# Patient Record
Sex: Female | Born: 1960
Health system: Southern US, Community
[De-identification: ages and names within clinical notes are randomized; demographics above are authoritative.]

## PROBLEM LIST (undated history)

## (undated) DIAGNOSIS — J189 Pneumonia, unspecified organism: Secondary | ICD-10-CM

## (undated) DIAGNOSIS — I10 Essential (primary) hypertension: Secondary | ICD-10-CM

## (undated) DIAGNOSIS — E7800 Pure hypercholesterolemia, unspecified: Secondary | ICD-10-CM

## (undated) DIAGNOSIS — T8859XA Other complications of anesthesia, initial encounter: Secondary | ICD-10-CM

## (undated) DIAGNOSIS — R51 Headache: Secondary | ICD-10-CM

## (undated) DIAGNOSIS — T4145XA Adverse effect of unspecified anesthetic, initial encounter: Secondary | ICD-10-CM

## (undated) DIAGNOSIS — E039 Hypothyroidism, unspecified: Secondary | ICD-10-CM

## (undated) DIAGNOSIS — K259 Gastric ulcer, unspecified as acute or chronic, without hemorrhage or perforation: Secondary | ICD-10-CM

## (undated) HISTORY — PX: OTHER SURGICAL HISTORY: SHX169

## (undated) HISTORY — PX: BREAST BIOPSY: SHX20

## (undated) HISTORY — PX: TONSILLECTOMY: SUR1361

## (undated) HISTORY — PX: COLONOSCOPY: SHX174

## (undated) HISTORY — PX: KNEE ARTHROSCOPY W/ ORIF: SUR98

---

## 1987-01-03 HISTORY — PX: DILATION AND CURETTAGE OF UTERUS: SHX78

## 2000-01-03 HISTORY — PX: TUBAL LIGATION: SHX77

## 2000-04-30 ENCOUNTER — Other Ambulatory Visit: Admission: RE | Admit: 2000-04-30 | Discharge: 2000-04-30 | Payer: Self-pay | Admitting: *Deleted

## 2000-08-02 ENCOUNTER — Ambulatory Visit (HOSPITAL_COMMUNITY): Admission: RE | Admit: 2000-08-02 | Discharge: 2000-08-02 | Payer: Self-pay | Admitting: Family Medicine

## 2000-08-02 ENCOUNTER — Encounter: Payer: Self-pay | Admitting: Family Medicine

## 2001-05-16 ENCOUNTER — Other Ambulatory Visit: Admission: RE | Admit: 2001-05-16 | Discharge: 2001-05-16 | Payer: Self-pay | Admitting: Dermatology

## 2001-08-20 ENCOUNTER — Ambulatory Visit (HOSPITAL_COMMUNITY): Admission: RE | Admit: 2001-08-20 | Discharge: 2001-08-20 | Payer: Self-pay | Admitting: *Deleted

## 2001-08-20 ENCOUNTER — Encounter: Payer: Self-pay | Admitting: *Deleted

## 2003-02-11 ENCOUNTER — Emergency Department (HOSPITAL_COMMUNITY): Admission: EM | Admit: 2003-02-11 | Discharge: 2003-02-11 | Payer: Self-pay | Admitting: Emergency Medicine

## 2003-02-24 ENCOUNTER — Ambulatory Visit (HOSPITAL_COMMUNITY): Admission: RE | Admit: 2003-02-24 | Discharge: 2003-02-24 | Payer: Self-pay | Admitting: Family Medicine

## 2003-04-21 ENCOUNTER — Ambulatory Visit (HOSPITAL_COMMUNITY): Admission: RE | Admit: 2003-04-21 | Discharge: 2003-04-21 | Payer: Self-pay | Admitting: *Deleted

## 2004-01-03 HISTORY — PX: OTHER SURGICAL HISTORY: SHX169

## 2005-05-02 ENCOUNTER — Ambulatory Visit (HOSPITAL_COMMUNITY): Admission: RE | Admit: 2005-05-02 | Discharge: 2005-05-02 | Payer: Self-pay | Admitting: Obstetrics and Gynecology

## 2005-05-11 ENCOUNTER — Ambulatory Visit (HOSPITAL_COMMUNITY): Admission: RE | Admit: 2005-05-11 | Discharge: 2005-05-11 | Payer: Self-pay | Admitting: Obstetrics and Gynecology

## 2005-06-26 ENCOUNTER — Ambulatory Visit (HOSPITAL_COMMUNITY): Admission: RE | Admit: 2005-06-26 | Discharge: 2005-06-26 | Payer: Self-pay | Admitting: Obstetrics and Gynecology

## 2005-06-26 ENCOUNTER — Encounter (INDEPENDENT_AMBULATORY_CARE_PROVIDER_SITE_OTHER): Payer: Self-pay | Admitting: Specialist

## 2006-05-24 ENCOUNTER — Emergency Department (HOSPITAL_COMMUNITY): Admission: EM | Admit: 2006-05-24 | Discharge: 2006-05-25 | Payer: Self-pay | Admitting: Emergency Medicine

## 2006-05-24 ENCOUNTER — Ambulatory Visit (HOSPITAL_COMMUNITY): Admission: RE | Admit: 2006-05-24 | Discharge: 2006-05-24 | Payer: Self-pay | Admitting: Obstetrics and Gynecology

## 2007-03-19 ENCOUNTER — Other Ambulatory Visit: Admission: RE | Admit: 2007-03-19 | Discharge: 2007-03-19 | Payer: Self-pay | Admitting: Obstetrics and Gynecology

## 2007-04-25 ENCOUNTER — Ambulatory Visit (HOSPITAL_COMMUNITY): Admission: RE | Admit: 2007-04-25 | Discharge: 2007-04-25 | Payer: Self-pay | Admitting: Family Medicine

## 2007-05-18 ENCOUNTER — Emergency Department (HOSPITAL_COMMUNITY): Admission: EM | Admit: 2007-05-18 | Discharge: 2007-05-18 | Payer: Self-pay | Admitting: Emergency Medicine

## 2007-06-04 ENCOUNTER — Ambulatory Visit (HOSPITAL_COMMUNITY): Admission: RE | Admit: 2007-06-04 | Discharge: 2007-06-04 | Payer: Self-pay | Admitting: General Surgery

## 2007-07-04 ENCOUNTER — Ambulatory Visit (HOSPITAL_COMMUNITY): Admission: RE | Admit: 2007-07-04 | Discharge: 2007-07-04 | Payer: Self-pay | Admitting: Family Medicine

## 2007-07-17 ENCOUNTER — Ambulatory Visit (HOSPITAL_COMMUNITY): Admission: RE | Admit: 2007-07-17 | Discharge: 2007-07-17 | Payer: Self-pay | Admitting: Obstetrics and Gynecology

## 2007-12-18 ENCOUNTER — Ambulatory Visit (HOSPITAL_COMMUNITY): Admission: RE | Admit: 2007-12-18 | Discharge: 2007-12-18 | Payer: Self-pay | Admitting: Family Medicine

## 2009-04-01 IMAGING — CT CT ABDOMEN W/ CM
1 of 3 series · 14 of 32 positions shown, 19 images · IV contrast (Omnipaque 300)
Comparison: none

CT ABDOMEN AND PELVIS WITH CONTRAST
TECHNIQUE: Multidetector CT imaging of the abdomen and pelvis was
performed using the standard protocol following bolus
administration of intravenous contrast.

CT ABDOMEN

[Series 2: abd_pel 5.0 b40f · axial · 0.80mm/px · z∈[-508,-84]mm · 14 of 99 slices shown, 19 images]
[im 7/99  soft-tissue]
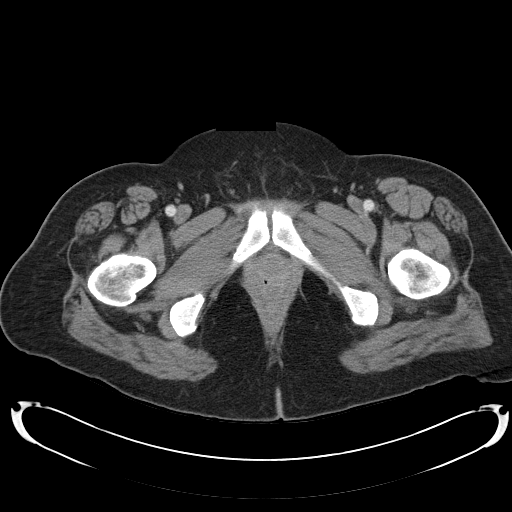
[im 7/99  bone]
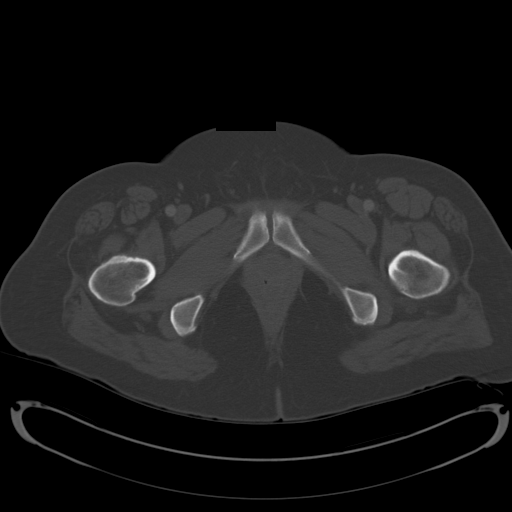
[im 13/99  soft-tissue]
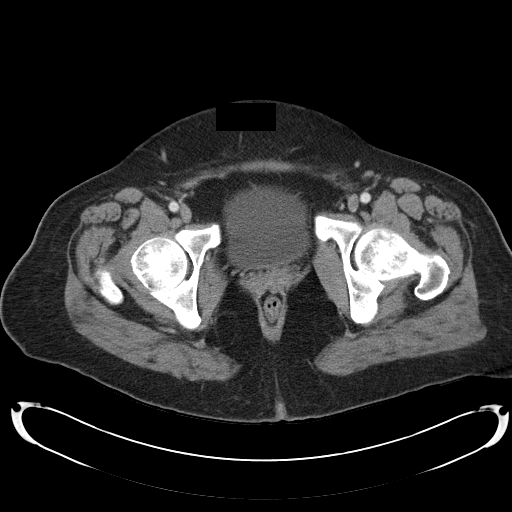
[im 19/99  soft-tissue]
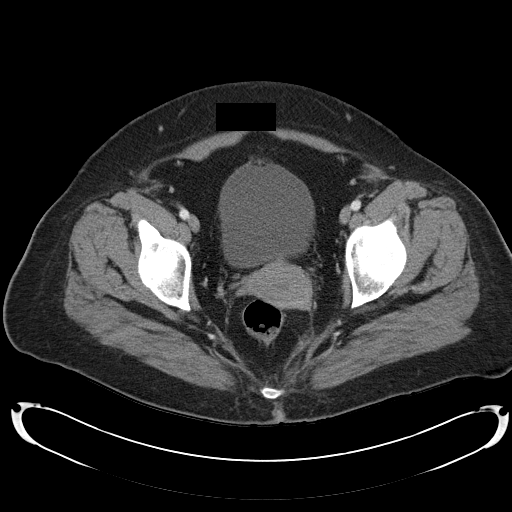
[im 31/99  soft-tissue]
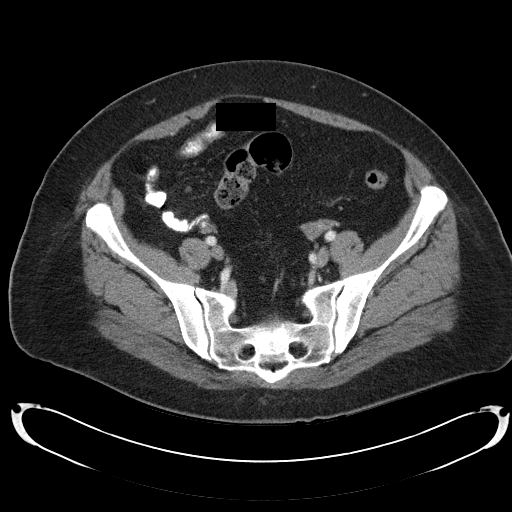
[im 37/99  soft-tissue]
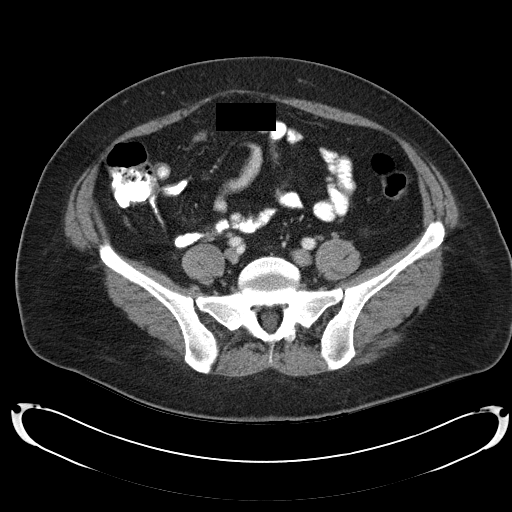
[im 43/99  soft-tissue]
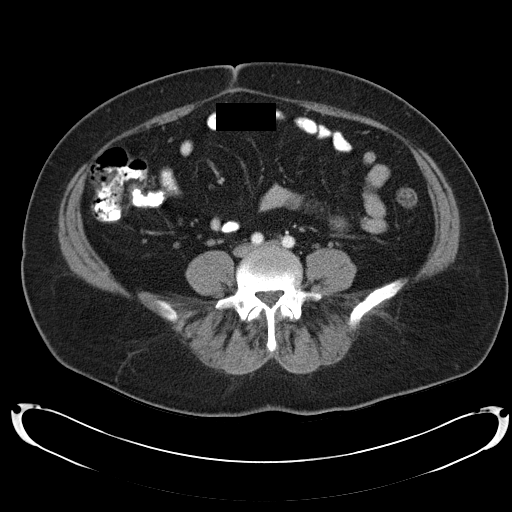
[im 50/99  soft-tissue]
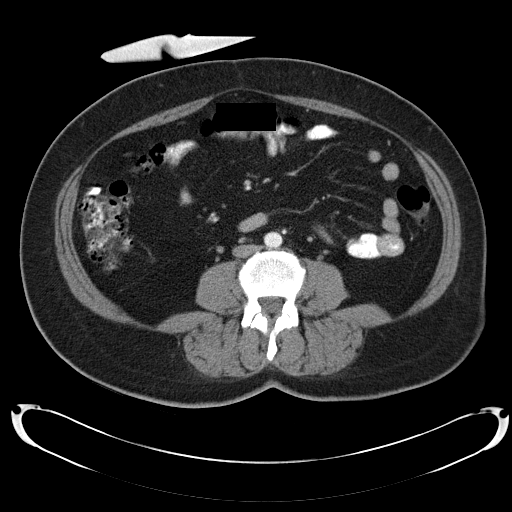
[im 56/99  soft-tissue]
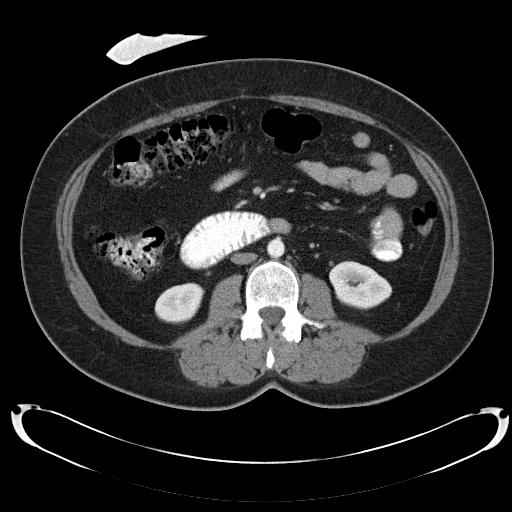
[im 62/99  soft-tissue]
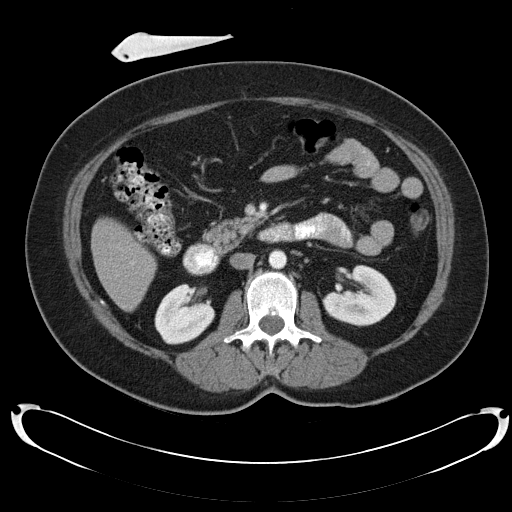
[im 62/99  bone]
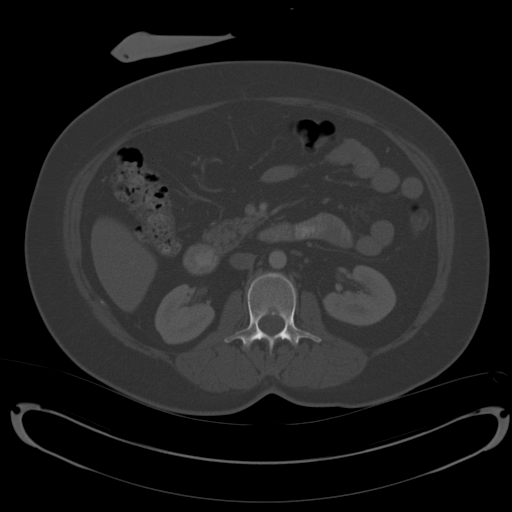
[im 68/99  soft-tissue]
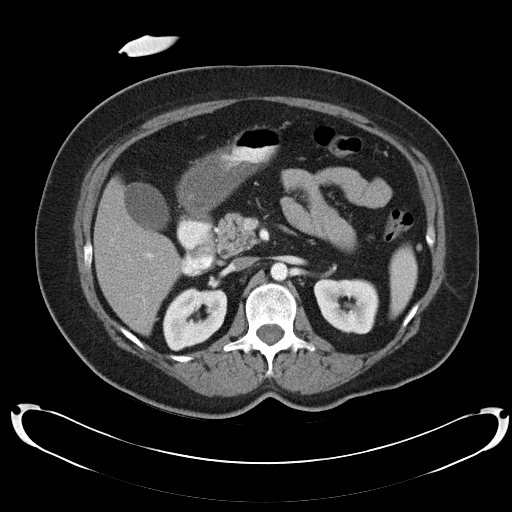
[im 74/99  lung]
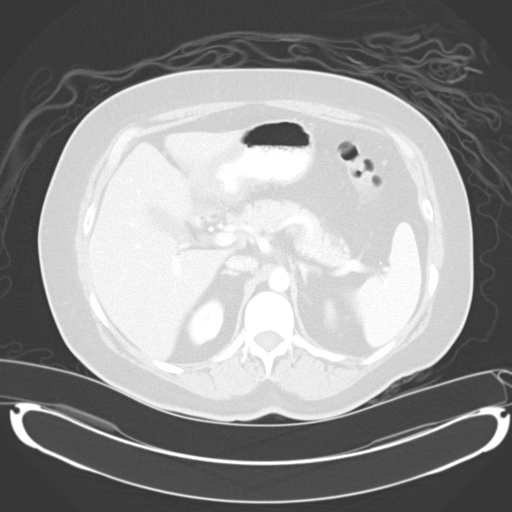
[im 80/99  soft-tissue]
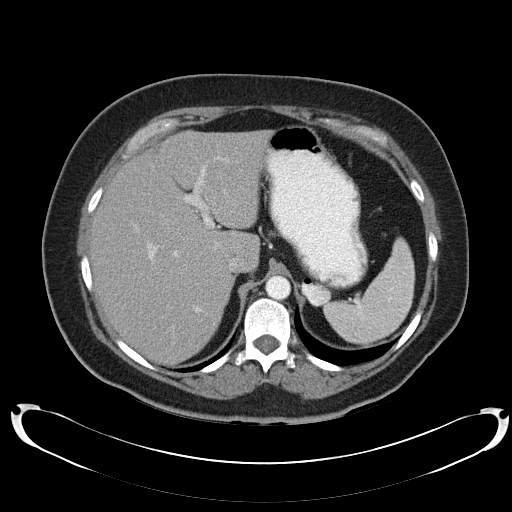
[im 80/99  lung]
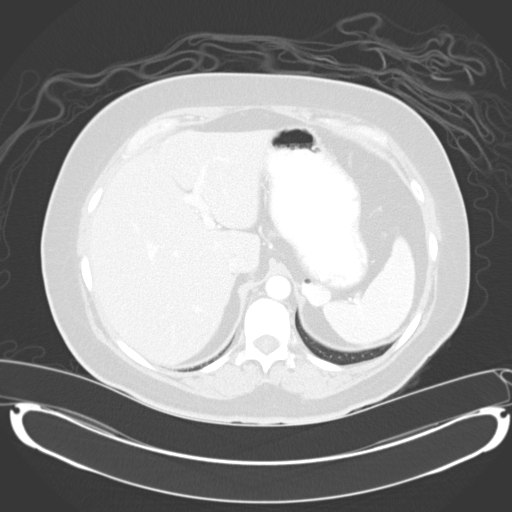
[im 86/99  soft-tissue]
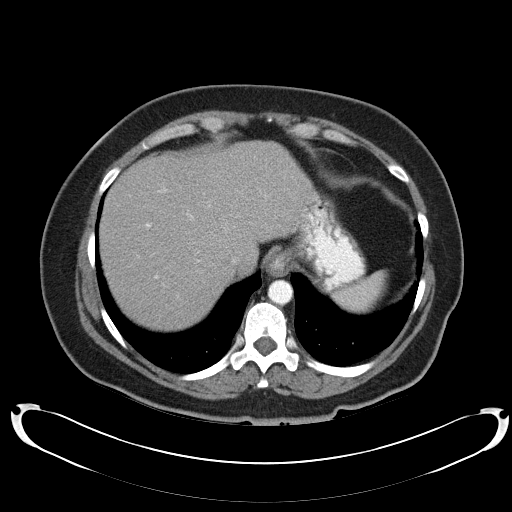
[im 86/99  lung]
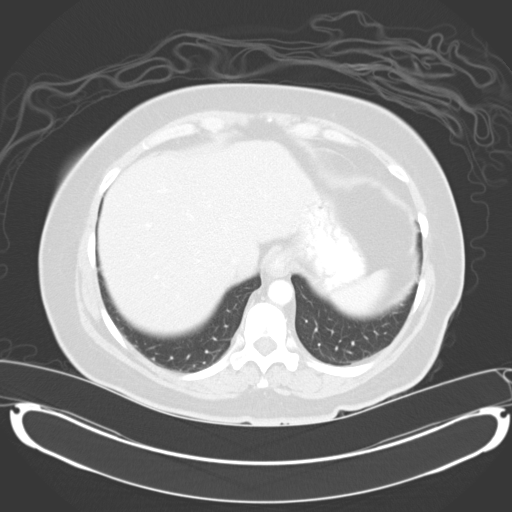
[im 92/99  soft-tissue]
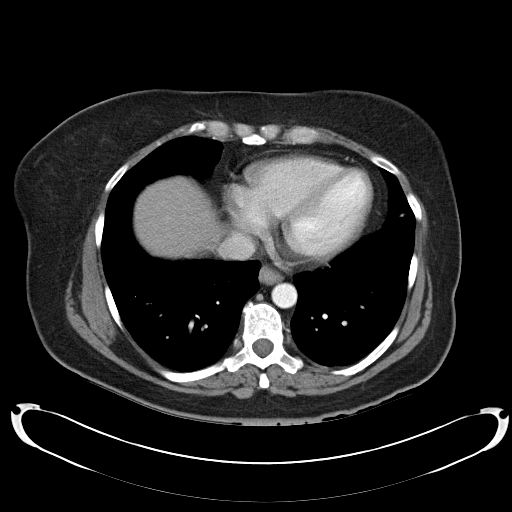
[im 92/99  lung]
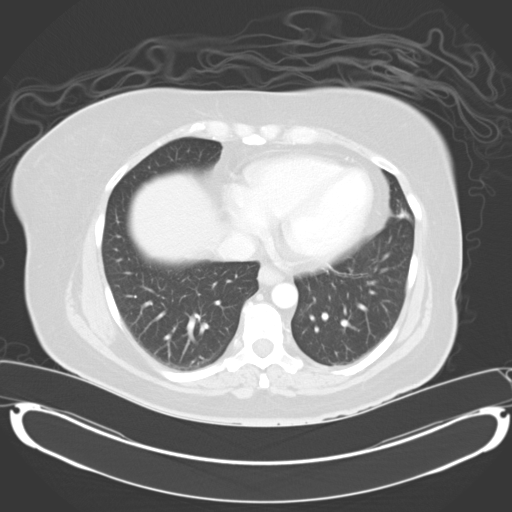

[14 of 32 positions shown; findings below may reference images not displayed]

FINDINGS: No focal abnormality is seen in the liver or spleen.
There is irregular wall thickening in the gastric antrum.  A
diverticulum is seen at the gastric fundus.  The duodenum and
duodenal bulb are spared.  The pancreas, gallbladder, adrenal
glands, and kidneys have normal imaging features.

No intraperitoneal free fluid.  No abdominal lymphadenopathy.
There are some small lymph nodes adjacent to the antral pyloric
region of the stomach.  No abdominal aortic aneurysm.
IMPRESSION: Abnormal wall thickening of the gastric antrum with small lymph
nodes adjacent to the antral pyloric region of the stomach.  This
could be related to an infectious or inflammatory antritis, but
neoplastic involvement could also have this appearance.  The upper
endoscopy may prove helpful to further evaluate.

Posterior gastric diverticulum at the fundus.

CT PELVIS
FINDINGS: There is no pelvic sidewall lymphadenopathy.  Fibroid
changes seen within the uterus.  17 mm dominant follicle noted in
the left ovary. Right ovary is normal.  No intraperitoneal free
fluid.

A 2.3 x 1.9 cm water density well-defined lesion is seen in the
left hemi pelvis.  This is distinct from the ovary, psoas muscle,
small bowel and colon.  It is unchanged in the interval.  This
could be related to a mesenteric or retroperitoneal cyst.  The
colon is unremarkable.  The terminal ileum and the appendix have
normal imaging features.
IMPRESSION: No new or acute findings in the anatomic pelvis.

[DATE] x 1.9 cm water density well-defined lesion in the left hemi
pelvis.  No suspicious or worrisome associated features are
evident.  This may be a mesenteric or retroperitoneal cyst.  The 1
year of imaging stability is reassuring .  Consider pelvic MRI in
12 months to obtain 2 years of stable imaging follow-up..

## 2010-01-22 ENCOUNTER — Encounter: Payer: Self-pay | Admitting: Family Medicine

## 2010-03-31 ENCOUNTER — Ambulatory Visit (INDEPENDENT_AMBULATORY_CARE_PROVIDER_SITE_OTHER): Payer: Self-pay | Admitting: Otolaryngology

## 2010-03-31 DIAGNOSIS — J310 Chronic rhinitis: Secondary | ICD-10-CM

## 2010-03-31 DIAGNOSIS — J320 Chronic maxillary sinusitis: Secondary | ICD-10-CM

## 2010-04-01 ENCOUNTER — Other Ambulatory Visit (INDEPENDENT_AMBULATORY_CARE_PROVIDER_SITE_OTHER): Payer: Self-pay | Admitting: Otolaryngology

## 2010-04-01 DIAGNOSIS — J329 Chronic sinusitis, unspecified: Secondary | ICD-10-CM

## 2010-04-05 ENCOUNTER — Other Ambulatory Visit: Payer: Self-pay | Admitting: Obstetrics and Gynecology

## 2010-04-05 ENCOUNTER — Ambulatory Visit (HOSPITAL_COMMUNITY)
Admission: RE | Admit: 2010-04-05 | Discharge: 2010-04-05 | Disposition: A | Discharge status: Home / Self Care | Payer: BC Managed Care – PPO | Source: Ambulatory Visit | Attending: Otolaryngology | Admitting: Otolaryngology

## 2010-04-05 ENCOUNTER — Encounter (HOSPITAL_COMMUNITY): Payer: Self-pay

## 2010-04-05 DIAGNOSIS — Z139 Encounter for screening, unspecified: Secondary | ICD-10-CM

## 2010-04-05 DIAGNOSIS — J329 Chronic sinusitis, unspecified: Secondary | ICD-10-CM

## 2010-04-05 DIAGNOSIS — R51 Headache: Secondary | ICD-10-CM | POA: Insufficient documentation

## 2010-04-05 DIAGNOSIS — J342 Deviated nasal septum: Secondary | ICD-10-CM | POA: Insufficient documentation

## 2010-04-05 HISTORY — DX: Essential (primary) hypertension: I10

## 2010-04-07 ENCOUNTER — Ambulatory Visit (HOSPITAL_COMMUNITY)
Admission: RE | Admit: 2010-04-07 | Discharge: 2010-04-07 | Disposition: A | Discharge status: Home / Self Care | Payer: BC Managed Care – PPO | Source: Ambulatory Visit | Attending: Obstetrics and Gynecology | Admitting: Obstetrics and Gynecology

## 2010-04-07 DIAGNOSIS — Z139 Encounter for screening, unspecified: Secondary | ICD-10-CM

## 2010-04-07 DIAGNOSIS — Z1231 Encounter for screening mammogram for malignant neoplasm of breast: Secondary | ICD-10-CM | POA: Insufficient documentation

## 2010-04-21 ENCOUNTER — Ambulatory Visit (INDEPENDENT_AMBULATORY_CARE_PROVIDER_SITE_OTHER): Payer: BC Managed Care – PPO | Admitting: Otolaryngology

## 2010-04-21 DIAGNOSIS — J343 Hypertrophy of nasal turbinates: Secondary | ICD-10-CM

## 2010-04-21 DIAGNOSIS — J310 Chronic rhinitis: Secondary | ICD-10-CM

## 2010-04-21 DIAGNOSIS — G43809 Other migraine, not intractable, without status migrainosus: Secondary | ICD-10-CM

## 2010-05-17 NOTE — H&P (Signed)
NAME:  Maria Price, Maria Price               ACCOUNT NO.:  [id number]   MEDICAL RECORD NO.:  [id number]          PATIENT TYPE:  AMB   LOCATION:  DAY                           FACILITY:  [facility]   PHYSICIAN:  Dalia Heading, M.D.  DATE OF BIRTH:  November 20, 1960   DATE OF ADMISSION:  DATE OF DISCHARGE:  LH                              HISTORY & PHYSICAL   CHIEF COMPLAINT:  Need for EGD and colonoscopy for family history colon  carcinoma and epigastric pain.   HISTORY OF PRESENT ILLNESS:  The patient is a 50 year old white female  who is referred for endoscopic evaluation.  She needs a coloscopy due to  a family history of colon carcinoma and EGD for epigastric pain.  She  had epigastric pain last week, though it has since resolved.  A CT scan  of the abdomen and pelvis revealed thickening of the antrum.  No nausea,  vomiting, weight loss, diarrhea, constipation, melena, or hematochezia  have been noted.  She has never had a colonoscopy.  Her mother at 6  years of age had colon cancer.   PAST MEDICAL HISTORY:  Includes:  1. Hypertension.  2. Extrinsic allergies.  3. Hypothyroidism.   PAST SURGICAL HISTORY:  1. Thyroid surgery.  2. Tubal ligation.  3. Thermal ablation of the uterus.   CURRENT MEDICATIONS:  1. Ziac.  2. Singulair.  3. Zocor.  4. Prilosec.  5. Synthroid.   ALLERGIES:  SULFA DRUGS.   REVIEW OF SYSTEMS:  Noncontributory.   PHYSICAL EXAMINATION:  GENERAL:  The patient is a well-developed, well-  nourished white female in no acute distress.  LUNGS:  Clear to auscultation with equal good breath sounds bilaterally.  HEART:  Reveals regular rate and rhythm without S3, S4, or murmurs.  ABDOMEN:  Soft, nontender, and nondistended.  No hepatosplenomegaly or  masses are noted.  RECTAL:  Deferred to the procedure.   IMPRESSION:  Family history of colon carcinoma, epigastric pain.   PLAN:  The patient is scheduled for an EGD and colonoscopy on June 04, 2007.  The risks and  benefits of the procedure including bleeding and  perforation were fully explained to the patient, who gave informed  consent.      Dalia Heading, M.D.  Electronically Signed     MAJ/MEDQ  D:  05/28/2007  T:  05/29/2007  Job:  [id number]   cc:   Corrie Mckusick, M.D.  Fax: [phone]

## 2010-05-20 NOTE — Op Note (Signed)
NAME:  Maria Price, CASALI               ACCOUNT NO.:  [id number]   MEDICAL RECORD NO.:  [id number]          PATIENT TYPE:  AMB   LOCATION:  DAY                           FACILITY:  [facility]   PHYSICIAN:  Tilda Burrow, M.D. DATE OF BIRTH:  [birth year]   DATE OF PROCEDURE:  DATE OF DISCHARGE:                                 OPERATIVE REPORT   PREOPERATIVE DIAGNOSIS:  Menorrhagia, uterine fibroids.   POSTOPERATIVE DIAGNOSIS:  Menorrhagia, uterine fibroids.   PROCEDURE:  Hysteroscopy, dilation and curettage, endometrial ablation.   SURGEON:  Tilda Burrow, M.D.   ASSISTANT:  __________, R.N.   ANESTHESIA:  General.   COMPLICATIONS:  None.   FINDINGS:  Uterus sounded to 9 cm.  Thin endometrial cavity.  Easily  visualized tubal ostia bilaterally.   DESCRIPTION OF PROCEDURE:  The patient was taken to the operating room and  prepped and draped for vaginal procedure.   The cervix was grasped with a single-tooth tenaculum.  The uterus sounded to  9 cm, 3.5 inches.  The cervix dilated to 25 [place], allowing introduction of  a dilator which was then used up to 25 mm to dilate the endocervical canal.  Hysteroscopy was then performed, easily visualizing the uterine fundus.  There was no suspicion of perforation or defect.  The tubal ostia could be  visualized and photo documentation taken, though the camera was not  functioning particularly well.  The procedure then was completed with brief  curettage which showed minimal tissue obtained.  Repeat hysteroscopy  confirmed that there was indeed still remaining a smooth, thin endometrial  cavity lining.   Endometrial ablation:  Ablation technique was then used with Gynecare  ThermaChoice 3 which was then used after prepping filled with 12 cc of D5W  and heated to 87 degrees centigrade and an 8-minute thermal ablation  technique used.  The entire 12 cc of fluid used was returned upon  aspiration.  Measurement of the fluid used for hysteroscopy  was performed  and 750 used was accounted for.   The procedure was considered completed after we did a paracervical block  with 0.5% Marcaine with epinephrine x21 cc.  The patient went to the  recovery room in good condition.  Sponge and needle counts correct.      Tilda Burrow, M.D.  Electronically Signed     JVF/MEDQ  D:  06/26/2005  T:  06/26/2005  Job:  [id number]   cc:   [facility]

## 2010-09-28 LAB — CBC
HCT: 38.9
MCV: 92.4
Platelets: 261
WBC: 10

## 2010-09-28 LAB — URINE MICROSCOPIC-ADD ON

## 2010-09-28 LAB — BASIC METABOLIC PANEL
BUN: 9
CO2: 28
Calcium: 8.6
Creatinine, Ser: 0.71
GFR calc Af Amer: >60
Glucose, Bld: 120 — ABNORMAL HIGH

## 2010-09-28 LAB — URINALYSIS, ROUTINE W REFLEX MICROSCOPIC
Glucose, UA: NEGATIVE
Hgb urine dipstick: NEGATIVE
Ketones, ur: NEGATIVE
Leukocytes, UA: NEGATIVE
Nitrite: NEGATIVE
Protein, ur: 30 — AB
pH: 8.5 — ABNORMAL HIGH

## 2010-09-28 LAB — DIFFERENTIAL
Lymphocytes Relative: 8 — ABNORMAL LOW
Monocytes Relative: 5

## 2010-09-28 LAB — SAMPLE TO BLOOD BANK

## 2010-09-28 LAB — HEPATIC FUNCTION PANEL
ALT: 12
AST: 15
Albumin: 3.8
Bilirubin, Direct: 0.1
Total Protein: 7.1

## 2010-09-28 LAB — LIPASE, BLOOD: Lipase: 31

## 2010-09-28 LAB — PROTIME-INR: Prothrombin Time: 12.7

## 2010-09-29 LAB — CLOTEST (H. PYLORI), BIOPSY: Helicobacter screen: NEGATIVE

## 2011-04-06 ENCOUNTER — Other Ambulatory Visit (HOSPITAL_COMMUNITY)
Admission: RE | Admit: 2011-04-06 | Discharge: 2011-04-06 | Disposition: A | Discharge status: Home / Self Care | Payer: 59 | Source: Ambulatory Visit | Attending: Obstetrics and Gynecology | Admitting: Obstetrics and Gynecology

## 2011-04-06 ENCOUNTER — Other Ambulatory Visit: Payer: Self-pay | Admitting: Obstetrics and Gynecology

## 2011-04-06 DIAGNOSIS — Z01419 Encounter for gynecological examination (general) (routine) without abnormal findings: Secondary | ICD-10-CM | POA: Insufficient documentation

## 2012-02-21 ENCOUNTER — Other Ambulatory Visit (HOSPITAL_COMMUNITY): Payer: Self-pay | Admitting: Family Medicine

## 2012-02-21 DIAGNOSIS — Z139 Encounter for screening, unspecified: Secondary | ICD-10-CM

## 2012-02-26 ENCOUNTER — Ambulatory Visit (HOSPITAL_COMMUNITY)
Admission: RE | Admit: 2012-02-26 | Discharge: 2012-02-26 | Disposition: A | Discharge status: Home / Self Care | Payer: PRIVATE HEALTH INSURANCE | Source: Ambulatory Visit | Attending: Family Medicine | Admitting: Family Medicine

## 2012-02-26 DIAGNOSIS — Z139 Encounter for screening, unspecified: Secondary | ICD-10-CM

## 2012-02-26 DIAGNOSIS — Z1231 Encounter for screening mammogram for malignant neoplasm of breast: Secondary | ICD-10-CM | POA: Insufficient documentation

## 2012-03-01 ENCOUNTER — Other Ambulatory Visit: Payer: Self-pay | Admitting: Family Medicine

## 2012-03-01 DIAGNOSIS — R928 Other abnormal and inconclusive findings on diagnostic imaging of breast: Secondary | ICD-10-CM

## 2012-03-19 NOTE — H&P (Signed)
  NTS SOAP Note  Vital Signs:  Vitals as of: 03/19/2012: Systolic 143: Diastolic 82: Heart Rate 70: Temp 28F: Height 76ft 8in: Weight 184Lbs 0 Ounces: Pain Level 7: BMI 27.98  BMI : 27.98 kg/m2  Subjective: This 52 Years 2 Months old Female presents for follow up colonoscopy.  Last had a TCS five years ago due to family h/o colon cancer.  Denies any new gi complaints.   Review of Symptoms:  Constitutional:unremarkable      headaches Eyes:unremarkable   Nose/Mouth/Throat:unremarkable Cardiovascular:  unremarkable   Respiratory:unremarkable   Gastrointestinal:  unremarkable   Genitourinary:unremarkable     Musculoskeletal:unremarkable   Skin:unremarkable Hematolgic/Lymphatic:unremarkable     Allergic/Immunologic:unremarkable     Past Medical History:    Reviewed   Past Medical History  Surgical History: BTL, neck surgery for gland biopsy Medical Problems:  High Blood pressure, High cholesterol, Hypothyroidism Psychiatric History:  Depression Allergies: sulfa, z-pack Medications: ziac, zocor, synthroid, nortriptyline   Social History:Reviewed  Social History  Preferred Language: English Race:  White Ethnicity: Not Hispanic / Latino Age: 52 Years 2 Months Marital Status:  M Alcohol: 1 drink per week Recreational drug(s):  No   Smoking Status: Never smoker reviewed on 03/19/2012 Functional Status reviewed on mm/dd/yyyy ------------------------------------------------ Bathing: Normal Cooking: Normal Dressing: Normal Driving: Normal Eating: Normal Managing Meds: Normal Oral Care: Normal Shopping: Normal Toileting: Normal Transferring: Normal Walking: Normal Cognitive Status reviewed on mm/dd/yyyy ------------------------------------------------ Attention: Normal Decision Making: Normal Language: Normal Memory: Normal Motor: Normal Perception: Normal Problem Solving: Normal Visual and Spatial: Normal   Family  History:  Reviewed   Family History              Father:  Diabetes Type II, Coronary Artery Disease, thyoid disease             Mother:  Cancer-cancer    Objective Information: General:  Well appearing, well nourished in no distress. Heart:  RRR, no murmur Lungs:    CTA bilaterally, no wheezes, rhonchi, rales.  Breathing unlabored. Abdomen:Soft, NT/ND, no HSM, no masses.   deferred to procedure  Assessment:Family h/o colon cancer  Diagnosis &amp; Procedure Smart Code   Plan:[place] for TCS on 04/09/12.   Patient Education:Alternative treatments to surgery were discussed with patient (and family).  Risks and benefits  of procedure were fully explained to the patient (and family) who gave informed consent. Patient/family questions were addressed.  Follow-up:Pending Surgery

## 2012-03-20 ENCOUNTER — Ambulatory Visit (HOSPITAL_COMMUNITY)
Admission: RE | Admit: 2012-03-20 | Discharge: 2012-03-20 | Disposition: A | Discharge status: Home / Self Care | Payer: PRIVATE HEALTH INSURANCE | Source: Ambulatory Visit | Attending: Family Medicine | Admitting: Family Medicine

## 2012-03-20 ENCOUNTER — Other Ambulatory Visit: Payer: Self-pay | Admitting: Family Medicine

## 2012-03-20 DIAGNOSIS — R928 Other abnormal and inconclusive findings on diagnostic imaging of breast: Secondary | ICD-10-CM

## 2012-03-25 ENCOUNTER — Encounter (HOSPITAL_COMMUNITY): Payer: Self-pay | Admitting: Pharmacy Technician

## 2012-04-03 ENCOUNTER — Other Ambulatory Visit: Payer: Self-pay | Admitting: Family Medicine

## 2012-04-03 ENCOUNTER — Ambulatory Visit
Admission: RE | Admit: 2012-04-03 | Discharge: 2012-04-03 | Disposition: A | Discharge status: Home / Self Care | Payer: PRIVATE HEALTH INSURANCE | Source: Ambulatory Visit | Attending: Family Medicine | Admitting: Family Medicine

## 2012-04-03 DIAGNOSIS — R928 Other abnormal and inconclusive findings on diagnostic imaging of breast: Secondary | ICD-10-CM

## 2012-04-03 HISTORY — PX: BREAST BIOPSY: SHX20

## 2012-04-09 ENCOUNTER — Encounter (HOSPITAL_COMMUNITY): Payer: Self-pay | Admitting: *Deleted

## 2012-04-09 ENCOUNTER — Encounter (HOSPITAL_COMMUNITY)
Admission: RE | Disposition: A | Discharge status: Home / Self Care | Payer: Self-pay | Source: Ambulatory Visit | Attending: General Surgery

## 2012-04-09 ENCOUNTER — Ambulatory Visit (HOSPITAL_COMMUNITY)
Admission: RE | Admit: 2012-04-09 | Discharge: 2012-04-09 | Disposition: A | Discharge status: Home / Self Care | Payer: PRIVATE HEALTH INSURANCE | Source: Ambulatory Visit | Attending: General Surgery | Admitting: General Surgery

## 2012-04-09 DIAGNOSIS — Z800 Family history of malignant neoplasm of digestive organs: Secondary | ICD-10-CM | POA: Insufficient documentation

## 2012-04-09 DIAGNOSIS — Z1211 Encounter for screening for malignant neoplasm of colon: Secondary | ICD-10-CM | POA: Insufficient documentation

## 2012-04-09 DIAGNOSIS — D126 Benign neoplasm of colon, unspecified: Secondary | ICD-10-CM | POA: Insufficient documentation

## 2012-04-09 DIAGNOSIS — I10 Essential (primary) hypertension: Secondary | ICD-10-CM | POA: Insufficient documentation

## 2012-04-09 HISTORY — DX: Hypothyroidism, unspecified: E03.9

## 2012-04-09 HISTORY — PX: COLONOSCOPY: SHX5424

## 2012-04-09 HISTORY — DX: Pure hypercholesterolemia, unspecified: E78.00

## 2012-04-09 HISTORY — PX: COLONOSCOPY: SHX174

## 2012-04-09 HISTORY — DX: Headache: R51

## 2012-04-09 SURGERY — COLONOSCOPY
Anesthesia: Moderate Sedation

## 2012-04-09 MED ORDER — MIDAZOLAM HCL 5 MG/5ML IJ SOLN
INTRAMUSCULAR | Status: AC
  Filled 2012-04-09: qty 5

## 2012-04-09 MED ORDER — STERILE WATER FOR IRRIGATION IR SOLN
Status: DC | PRN
  Administered 2012-04-09: 09:00:00

## 2012-04-09 MED ORDER — MEPERIDINE HCL 25 MG/ML IJ SOLN
INTRAMUSCULAR | Status: DC | PRN
  Administered 2012-04-09: 50 mg via INTRAVENOUS

## 2012-04-09 MED ORDER — SODIUM CHLORIDE 0.9 % IV SOLN
INTRAVENOUS | Status: DC
  Administered 2012-04-09: 08:00:00 via INTRAVENOUS

## 2012-04-09 MED ORDER — MIDAZOLAM HCL 5 MG/5ML IJ SOLN
INTRAMUSCULAR | Status: DC | PRN
  Administered 2012-04-09: 1 mg via INTRAVENOUS
  Administered 2012-04-09: 4 mg via INTRAVENOUS

## 2012-04-09 MED ORDER — MEPERIDINE HCL 50 MG/ML IJ SOLN
INTRAMUSCULAR | Status: AC
  Filled 2012-04-09: qty 1

## 2012-04-09 NOTE — Interval H&P Note (Signed)
History and Physical Interval Note:  04/09/2012 8:37 AM  Kendra Opitz  has presented today for surgery, with the diagnosis of screening, f/h colon cancer  The various methods of treatment have been discussed with the patient and family. After consideration of risks, benefits and other options for treatment, the patient has consented to  Procedure(s): COLONOSCOPY (N/A) as a surgical intervention .  The patient's history has been reviewed, patient examined, no change in status, stable for surgery.  I have reviewed the patient's chart and labs.  Questions were answered to the patient's satisfaction.     Franky Macho A

## 2012-04-09 NOTE — Op Note (Signed)
[facility] [address] [place], [address]   COLONOSCOPY PROCEDURE REPORT  PATIENT: Maria Price, Maria Price  MR#: [id number] BIRTHDATE: Aug 09, 1960 , 51  yrs. old GENDER: Female ENDOSCOPIST: Franky Macho, MD REFERRED GN:FAOZHYQ, John PROCEDURE DATE:  04/09/2012 PROCEDURE:   Colonoscopy, screening ASA CLASS:   Class II INDICATIONS:Patient's immediate family history of colon cancer. MEDICATIONS: Versed 4 mg IV and Demerol 50 mg IV  DESCRIPTION OF PROCEDURE:   After the risks benefits and alternatives of the procedure were thoroughly explained, informed consent was obtained.  A digital rectal exam revealed no abnormalities of the rectum.   The EC-[id number] ([id number])  endoscope was introduced through the anus and advanced to the cecum, which was identified by both the appendix and ileocecal valve. No adverse events experienced.   The quality of the prep was adequate, using MoviPrep  The instrument was then slowly withdrawn as the colon was fully examined.      COLON FINDINGS: A small sessile polyp with a friable surface was found in the proximal ascending colon.  A polypectomy was performed using snare cautery.  The resection was complete and the polyp tissue was not retrieved.   The colon mucosa was otherwise normal. Retroflexed views revealed no abnormalities. The time to cecum=4 minutes 0 seconds.  Withdrawal time=10 minutes 0 seconds.  The scope was withdrawn and the procedure completed. COMPLICATIONS: There were no complications.  ENDOSCOPIC IMPRESSION: 1.   Small sessile polyp was found in the proximal ascending colon; polypectomy was performed using snare cautery 2.   The colon mucosa was otherwise normal  RECOMMENDATIONS: Repeat Colonoscopy in 3 years.   eSigned:  Franky Macho, MD 04/09/2012 9:00 AM   cc:

## 2012-04-10 ENCOUNTER — Other Ambulatory Visit: Payer: Self-pay | Admitting: Neurology

## 2012-04-11 ENCOUNTER — Encounter (HOSPITAL_COMMUNITY): Payer: Self-pay | Admitting: General Surgery

## 2012-04-12 ENCOUNTER — Encounter: Payer: Self-pay | Admitting: Nurse Practitioner

## 2012-04-12 ENCOUNTER — Ambulatory Visit (INDEPENDENT_AMBULATORY_CARE_PROVIDER_SITE_OTHER): Payer: No Typology Code available for payment source | Admitting: Nurse Practitioner

## 2012-04-12 VITALS — BP 124/80 | HR 77 | Ht 68.0 in | Wt 183.0 lb

## 2012-04-12 DIAGNOSIS — G43009 Migraine without aura, not intractable, without status migrainosus: Secondary | ICD-10-CM

## 2012-04-12 DIAGNOSIS — I10 Essential (primary) hypertension: Secondary | ICD-10-CM

## 2012-04-12 MED ORDER — NORTRIPTYLINE HCL 10 MG PO CAPS: ORAL_CAPSULE | ORAL | Status: DC

## 2012-04-12 NOTE — Patient Instructions (Addendum)
Continue nortriptyline 10 mg, 2 tabs every at bedtime, Rx filled Imitrex when necessary Followup in 6 months or sooner if problems arise

## 2012-04-12 NOTE — Progress Notes (Signed)
HPI: Patient returns for followup after last visit 09/11/2011. She has a history of migraines for as long as she can remember. Her typical migraine is right retro-orbital severe piercing pain lasting 30 minutes with associated photophobia and phonophobia relieved by lying in a dark quiet room. She denies motor or sensory deficits. Her triggers can be weather changes lack of sleep perfumes and windy weather. She currently goes several months without a headache and then they have several in a row. She is currently taking nortriptyline as a preventive and Imitrex as needed  ROS:  negative   Physical Exam General: well developed, well nourished, seated, in no evident distress Head: head normocephalic and atraumatic. Oropharynx benign Neck: supple with no carotid or supraclavicular bruits Cardiovascular: regular rate and rhythm, no murmurs  Neurologic Exam Mental Status: Awake and fully alert. Oriented to place and time. Recent and remote memory intact. Attention span, concentration and fund of knowledge appropriate. Mood and affect appropriate.  Cranial Nerves:  Pupils equal, briskly reactive to light. Extraocular movements full without nystagmus. Visual fields full to confrontation. Hearing intact and symmetric to finger snap. Facial sensation intact. Face, tongue, palate move normally and symmetrically. Neck flexion and extension normal.  Motor: Normal bulk and tone. Normal strength in all tested extremity muscles. Sensory.: intact to touch and pinprick and vibratory.  Coordination: Rapid alternating movements normal in all extremities. Finger-to-nose and heel-to-shin performed accurately bilaterally. Gait and Station: Arises from chair without difficulty. Stance is normal. Gait demonstrates normal stride length and balance . Able to heel, toe and tandem walk without difficulty.  Reflexes: 2+ and symmetric. Toes downgoing.     ASSESSMENT: Migraine headaches, well controlled at present. Normal  neurologic exam     PLAN: Continue nortriptyline as preventive medication, prescription renewed Continue Imitrex acutely Followup in 6 months or sooner if problems arise   Nilda Riggs, GNP-BC APRN

## 2012-08-09 ENCOUNTER — Other Ambulatory Visit: Payer: Self-pay | Admitting: Neurology

## 2012-10-16 ENCOUNTER — Encounter (INDEPENDENT_AMBULATORY_CARE_PROVIDER_SITE_OTHER): Payer: Self-pay

## 2012-10-16 ENCOUNTER — Encounter: Payer: Self-pay | Admitting: Nurse Practitioner

## 2012-10-16 ENCOUNTER — Ambulatory Visit (INDEPENDENT_AMBULATORY_CARE_PROVIDER_SITE_OTHER): Payer: No Typology Code available for payment source | Admitting: Nurse Practitioner

## 2012-10-16 VITALS — BP 115/72 | HR 65 | Ht 68.0 in | Wt 186.5 lb

## 2012-10-16 DIAGNOSIS — G43009 Migraine without aura, not intractable, without status migrainosus: Secondary | ICD-10-CM

## 2012-10-16 DIAGNOSIS — I10 Essential (primary) hypertension: Secondary | ICD-10-CM

## 2012-10-16 MED ORDER — SUMATRIPTAN SUCCINATE 100 MG PO TABS: 100.0000 mg | ORAL_TABLET | ORAL | Status: DC | PRN

## 2012-10-16 MED ORDER — NORTRIPTYLINE HCL 10 MG PO CAPS: ORAL_CAPSULE | ORAL | Status: DC

## 2012-10-16 NOTE — Patient Instructions (Signed)
Will refill nortriptyline and Imitrex Followup yearly and when necessary

## 2012-10-16 NOTE — Progress Notes (Signed)
[facility]  PATIENT: ARLI BREE DOB: June 09, 1960   REASON FOR VISIT: Followup for headaches    HISTORY OF PRESENT ILLNESS: Ms Vary, 52 year old returns for followup. She has a history of migraines for as long as she can remember. Her typical migraine is right retro-orbital severe piercing pain lasting 30 minutes with associated photophobia and phonophobia relieved by lying in a dark quiet room. She denies motor or sensory deficits. Her triggers can be weather changes lack of sleep perfumes and windy weather. She currently goes several months without a headache and then they have several in a row. She is currently taking nortriptyline as a preventive and Imitrex as needed. She claims she is doing well  REVIEW OF SYSTEMS: Full 14 system review of systems performed and notable only for:  Constitutional: N/A  Cardiovascular: N/A  Ear/Nose/Throat: N/A  Skin: N/A  Eyes: N/A  Respiratory: N/A  Gastroitestinal: N/A  Hematology/Lymphatic: N/A  Endocrine: Hot flashes  Musculoskeletal:N/A  Allergy/Immunology: N/A  Neurological: N/A Psychiatric: N/A   ALLERGIES: Allergies  Allergen Reactions  . Azithromycin   . Levofloxacin   . Sulfa Drugs Cross Reactors     HOME MEDICATIONS: Outpatient Prescriptions Prior to Visit  Medication Sig Dispense Refill  . bisoprolol-hydrochlorothiazide (ZIAC) 5-6.25 MG per tablet Take 1 tablet by mouth daily.      . fish oil-omega-3 fatty acids 1000 MG capsule Take 1 g by mouth 2 (two) times daily.      . nortriptyline (PAMELOR) 10 MG capsule 2 tabs every hs  60 capsule  5  . simvastatin (ZOCOR) 10 MG tablet Take 10 mg by mouth every evening.      . SUMAtriptan (IMITREX) 100 MG tablet TAKE 1 TABLET AS NEEDED FOR MIGRAINE.  9 tablet  3  . levothyroxine (SYNTHROID, LEVOTHROID) 50 MCG tablet Take 50 mcg by mouth daily.       No facility-administered medications prior to visit.    PAST MEDICAL HISTORY: Past Medical History    Diagnosis Date  . Hypertension   . Headache(784.0)     History of migraines  . Hypothyroidism   . Hypercholesteremia     PAST SURGICAL HISTORY: Past Surgical History  Procedure Laterality Date  . Tonsillectomy  age 52  . Tubal ligation  2002  . Dilation and curettage of uterus  1989  . Knee arthroscopy w/ orif Right   . Gland removed from neck    . Hysteroscopy, d&c, endometrial ablation  2006  . Colonoscopy    . Colonoscopy N/A 04/09/2012    Procedure: COLONOSCOPY;  Surgeon: Dalia Heading, MD;  Location: [facility];  Service: Gastroenterology;  Laterality: N/A;  . Colonoscopy  04-09-12    FAMILY HISTORY: Family History  Problem Relation Age of Onset  . Colon cancer Mother     SOCIAL HISTORY: History   Social History  . Marital Status: Married    Spouse Name: N/A    Number of Children: N/A  . Years of Education: N/A   Occupational History  . Not on file.   Social History Main Topics  . Smoking status: Never Smoker   . Smokeless tobacco: Never Used  . Alcohol Use: 0.6 oz/week    1 Glasses of wine per week     Comment: weekly   . Drug Use: No  . Sexual Activity: Not on file   Other Topics Concern  . Not on file   Social History Narrative  . No narrative on  file     PHYSICAL EXAM  Filed Vitals:   10/16/12 1511  BP: 115/72  Pulse: 65  Height: 5\' 8"  (1.727 m)  Weight: 186 lb 8 oz (84.596 kg)   Body mass index is 28.36 kg/(m^2).  Generalized: Well developed, in no acute distress  Head: normocephalic and atraumatic,. Oropharynx benign  Neck: Supple, no carotid bruits  Cardiac: Regular rate rhythm, no murmur  Musculoskeletal: No deformity   Neurological examination   Mentation: Alert oriented to time, place, history taking. Follows all commands speech and language fluent  Cranial nerve II-XII: Pupils were equal round reactive to light extraocular movements were full, visual field were full on confrontational test. Facial sensation and strength  were normal. hearing was intact to finger rubbing bilaterally. Uvula tongue midline. head turning and shoulder shrug and were normal and symmetric.Tongue protrusion into cheek strength was normal. Motor: normal bulk and tone, full strength in the BUE, BLE, fine finger movements normal, no pronator drift. No focal weakness Coordination: finger-nose-finger, heel-to-shin bilaterally, no dysmetria Reflexes: Brachioradialis 2/2, biceps 2/2, triceps 2/2, patellar 2/2, Achilles 2/2, plantar responses were flexor bilaterally. Gait and Station: Rising up from seated position without assistance, normal stance, moderate stride, good arm swing, smooth turning, able to perform tiptoe, and heel walking without difficulty. Tandem gait steady  DIAGNOSTIC DATA (LABS, IMAGING, TESTING) -None to review   ASSESSMENT AND PLAN  52 y.o. year old female  has a past medical history of Hypertension; Headache(784.0); Hypothyroidism; and Hypercholesteremia. here for followup. She is doing well in terms of her headaches.  Will refill nortriptyline and Imitrex Followup yearly and when necessary   Nilda Riggs, [facility], [facility], APRN  [facility] [address] [place], [place] [address] [phone]

## 2013-03-03 ENCOUNTER — Other Ambulatory Visit: Payer: Self-pay

## 2013-03-03 DIAGNOSIS — Z1231 Encounter for screening mammogram for malignant neoplasm of breast: Secondary | ICD-10-CM

## 2013-04-07 ENCOUNTER — Ambulatory Visit
Admission: RE | Admit: 2013-04-07 | Discharge: 2013-04-07 | Disposition: A | Discharge status: Home / Self Care | Payer: No Typology Code available for payment source | Source: Ambulatory Visit

## 2013-04-07 DIAGNOSIS — Z1231 Encounter for screening mammogram for malignant neoplasm of breast: Secondary | ICD-10-CM

## 2013-04-09 ENCOUNTER — Ambulatory Visit (INDEPENDENT_AMBULATORY_CARE_PROVIDER_SITE_OTHER): Payer: No Typology Code available for payment source | Admitting: Obstetrics and Gynecology

## 2013-04-09 ENCOUNTER — Encounter: Payer: Self-pay | Admitting: Obstetrics and Gynecology

## 2013-04-09 ENCOUNTER — Other Ambulatory Visit (HOSPITAL_COMMUNITY)
Admission: RE | Admit: 2013-04-09 | Discharge: 2013-04-09 | Disposition: A | Discharge status: Home / Self Care | Payer: No Typology Code available for payment source | Source: Ambulatory Visit | Attending: Obstetrics and Gynecology | Admitting: Obstetrics and Gynecology

## 2013-04-09 VITALS — BP 114/76 | Ht 67.5 in | Wt 190.0 lb

## 2013-04-09 DIAGNOSIS — Z01419 Encounter for gynecological examination (general) (routine) without abnormal findings: Secondary | ICD-10-CM | POA: Insufficient documentation

## 2013-04-09 DIAGNOSIS — Z1212 Encounter for screening for malignant neoplasm of rectum: Secondary | ICD-10-CM

## 2013-04-09 DIAGNOSIS — Z124 Encounter for screening for malignant neoplasm of cervix: Secondary | ICD-10-CM | POA: Insufficient documentation

## 2013-04-09 DIAGNOSIS — Z1151 Encounter for screening for human papillomavirus (HPV): Secondary | ICD-10-CM | POA: Insufficient documentation

## 2013-04-09 DIAGNOSIS — Z0000 Encounter for general adult medical examination without abnormal findings: Secondary | ICD-10-CM | POA: Insufficient documentation

## 2013-04-09 LAB — HEMOCCULT GUIAC POC 1CARD (OFFICE): FECAL OCCULT BLD: NEGATIVE

## 2013-04-09 NOTE — Progress Notes (Signed)
This chart was scribed by Jenne Campus, Medical Scribe, for Dr. Mallory Shirk on 04/09/13 at 9:10 AM. This chart was reviewed by Dr. Mallory Shirk and is accurate.  Assessment:  Annual Gyn Exam   Plan:  1. pap smear done, next pap due 3 years 2. return annually or prn 3    Annual mammogram advised 4   Colonoscopy every 3 years 5    Hemoccult every year 6   Info for [facility] given Subjective:  Maria Price is a 53 y.o. female with a h/o hysteroscopy and D&C. who presents for annual exam. No LMP recorded. Patient is postmenopausal. The patient has no complaints today. LNMP was in 2010. Menses after hysteroscopy were very light.  Family h/o colon CA and pt reports polyps on prior colonscopy  PCP is Dr. Hilma Favors. Had blood work done yesterday  The following portions of the patient's history were reviewed and updated as appropriate: allergies, current medications, past family history, past medical history, past social history, past surgical history and problem list.  Review of Systems Constitutional: negative Gastrointestinal: negative Genitourinary: negative  Objective:  BP 114/76  Ht 5' 7.5" (1.715 m)  Wt 190 lb (86.183 kg)  BMI 29.30 kg/m2   BMI: Body mass index is 29.3 kg/(m^2).  Chaperone present for exam which was performed with pt's permission General Appearance: Alert, appropriate appearance for age. No acute distress HEENT: Grossly normal Neck / Thyroid:  Cardiovascular: RRR; normal S1, S2, no murmur Lungs: CTA bilaterally Back: No CVAT Breast Exam: No dimpling, nipple retraction or discharge. No masses or nodes., Normal to inspection and Normal breast tissue bilaterally Gastrointestinal: Soft, non-tender, no masses or organomegaly Pelvic Exam: External genitalia: normal general appearance Urinary system: urethral meatus normal Vaginal: normal mucosa without prolapse or lesions Cervix: normal appearance Adnexa: normal bimanual exam Uterus: normal single,  nontender Rectal: good sphincter tone, no masses and guaiac negative Good support, no uterine fullness noted Thin prep PAP obtained Lymphatic Exam: Non-palpable nodes in neck, clavicular, axillary, or inguinal regions  Skin: no rash or abnormalities Neurologic: Normal gait and speech, no tremor  Psychiatric: Alert and oriented, appropriate affect.  Urinalysis:Not done  Mallory Shirk. MD Pgr [phone] 9:20 AM

## 2013-04-09 NOTE — Patient Instructions (Signed)
Results of the PAP will be sent by mail in 2 weeks. If it is abnormal, we will call you sooner. If you have not received word either by phone or mail after 2 weeks, please call the office. Thank you for allowing [place] to take care of you! Conjugated Estrogens vaginal cream What is this medicine? CONJUGATED ESTROGENS (CON ju gate ed ESS troe jenz) are a mixture of female hormones. This cream can help relieve symptoms associated with menopause.like vaginal dryness and irritation. This medicine may be used for other purposes; ask your health care provider or pharmacist if you have questions. COMMON BRAND NAME(S): Premarin What should I tell my health care provider before I take this medicine? They need to know if you have any of these conditions: -abnormal vaginal bleeding -blood vessel disease or blood clots -breast, cervical, endometrial, or uterine cancer -dementia -diabetes -gallbladder disease -heart disease or recent heart attack -high blood pressure -high cholesterol -high level of calcium in the blood -hysterectomy -kidney disease -liver disease -migraine headaches -protein C deficiency -protein S deficiency -stroke -systemic lupus erythematosus (SLE) -tobacco smoker -an unusual or allergic reaction to estrogens other medicines, foods, dyes, or preservatives -pregnant or trying to get pregnant -breast-feeding How should I use this medicine? This medicine is for use in the vagina only. Do not take by mouth. Follow the directions on the prescription label. Use at bedtime unless otherwise directed by your doctor or health care professional. Use the special applicator supplied with the cream. Wash hands before and after use. Fill the applicator with the cream and remove from the tube. Lie on your back, part and bend your knees. Insert the applicator into the vagina and push the plunger to expel the cream into the vagina. Wash the applicator with warm soapy water and rinse well. Use  exactly as directed for the complete length of time prescribed. Do not stop using except on the advice of your doctor or health care professional. Talk to your pediatrician regarding the use of this medicine in children. Special care may be needed. A patient package insert for the product will be given with each prescription and refill. Read this sheet carefully each time. The sheet may change frequently. Overdosage: If you think you have taken too much of this medicine contact a poison control center or emergency room at once. NOTE: This medicine is only for you. Do not share this medicine with others. What if I miss a dose? If you miss a dose, use it as soon as you can. If it is almost time for your next dose, use only that dose. Do not use double or extra doses. What may interact with this medicine? Do not take this medicine with any of the following medications: -aromatase inhibitors like aminoglutethimide, anastrozole, exemestane, letrozole, testolactone This medicine may also interact with the following medications: -barbiturates used for inducing sleep or treating seizures -carbamazepine -grapefruit juice -medicines for fungal infections like itraconazole and ketoconazole -raloxifene or tamoxifen -rifabutin -rifampin -rifapentine -ritonavir -some antibiotics used to treat infections -St. Kamora Vossler's Wort -warfarin This list may not describe all possible interactions. Give your health care provider a list of all the medicines, herbs, non-prescription drugs, or dietary supplements you use. Also tell them if you smoke, drink alcohol, or use illegal drugs. Some items may interact with your medicine. What should I watch for while using this medicine? Visit your health care professional for regular checks on your progress. You will need a regular breast and pelvic exam. You  should also discuss the need for regular mammograms with your health care professional, and follow his or her  guidelines. This medicine can make your body retain fluid, making your fingers, hands, or ankles swell. Your blood pressure can go up. Contact your doctor or health care professional if you feel you are retaining fluid. If you have any reason to think you are pregnant; stop taking this medicine at once and contact your doctor or health care professional. Tobacco smoking increases the risk of getting a blood clot or having a stroke, especially if you are more than 52 years old. You are strongly advised not to smoke. If you wear contact lenses and notice visual changes, or if the lenses begin to feel uncomfortable, consult your eye care specialist. If you are going to have elective surgery, you may need to stop taking this medicine beforehand. Consult your health care professional for advice prior to scheduling the surgery. What side effects may I notice from receiving this medicine? Side effects that you should report to your doctor or health care professional as soon as possible: -allergic reactions like skin rash, itching or hives, swelling of the face, lips, or tongue -breast tissue changes or discharge -changes in vision -chest pain -confusion, trouble speaking or understanding -dark urine -general ill feeling or flu-like symptoms -light-colored stools -nausea, vomiting -pain, swelling, warmth in the leg -right upper belly pain -severe headaches -shortness of breath -sudden numbness or weakness of the face, arm or leg -trouble walking, dizziness, loss of balance or coordination -unusual vaginal bleeding -yellowing of the eyes or skin Side effects that usually do not require medical attention (report to your doctor or health care professional if they continue or are bothersome): -hair loss -increased hunger or thirst -increased urination -symptoms of vaginal infection like itching, irritation or unusual discharge -unusually weak or tired This list may not describe all possible side  effects. Call your doctor for medical advice about side effects. You may report side effects to FDA at 1-800-FDA-1088. Where should I keep my medicine? Keep out of the reach of children. Store at room temperature between 15 and 30 degrees C (59 and 86 degrees F). Throw away any unused medicine after the expiration date. NOTE: This sheet is a summary. It may not cover all possible information. If you have questions about this medicine, talk to your doctor, pharmacist, or health care provider.  2014, Elsevier/Gold Standard. (2010-03-23 09:20:36)

## 2013-08-04 ENCOUNTER — Encounter: Payer: Self-pay | Admitting: Nurse Practitioner

## 2013-10-02 ENCOUNTER — Telehealth: Payer: Self-pay | Admitting: Nurse Practitioner

## 2013-10-02 NOTE — Telephone Encounter (Signed)
Called pt to R/S 11/12/13 appointment due to [place] being out of the office. LM with husband to have patient to call back and reschedule.

## 2013-10-17 ENCOUNTER — Ambulatory Visit: Payer: No Typology Code available for payment source | Admitting: Nurse Practitioner

## 2013-10-21 ENCOUNTER — Other Ambulatory Visit: Payer: Self-pay | Admitting: Nurse Practitioner

## 2013-10-21 NOTE — Telephone Encounter (Signed)
Patient has appt in Jan

## 2013-11-12 ENCOUNTER — Ambulatory Visit: Payer: No Typology Code available for payment source | Admitting: Nurse Practitioner

## 2013-11-19 ENCOUNTER — Emergency Department (HOSPITAL_COMMUNITY): Payer: No Typology Code available for payment source

## 2013-11-19 ENCOUNTER — Encounter (HOSPITAL_COMMUNITY): Payer: Self-pay | Admitting: *Deleted

## 2013-11-19 ENCOUNTER — Emergency Department (HOSPITAL_COMMUNITY)
Admission: EM | Admit: 2013-11-19 | Discharge: 2013-11-19 | Disposition: A | Discharge status: Home / Self Care | Payer: No Typology Code available for payment source | Attending: Emergency Medicine | Admitting: Emergency Medicine

## 2013-11-19 DIAGNOSIS — I10 Essential (primary) hypertension: Secondary | ICD-10-CM | POA: Insufficient documentation

## 2013-11-19 DIAGNOSIS — G43909 Migraine, unspecified, not intractable, without status migrainosus: Secondary | ICD-10-CM | POA: Diagnosis not present

## 2013-11-19 DIAGNOSIS — R609 Edema, unspecified: Secondary | ICD-10-CM

## 2013-11-19 DIAGNOSIS — E039 Hypothyroidism, unspecified: Secondary | ICD-10-CM | POA: Insufficient documentation

## 2013-11-19 DIAGNOSIS — Z79899 Other long term (current) drug therapy: Secondary | ICD-10-CM | POA: Insufficient documentation

## 2013-11-19 DIAGNOSIS — Y9389 Activity, other specified: Secondary | ICD-10-CM | POA: Diagnosis not present

## 2013-11-19 DIAGNOSIS — M25422 Effusion, left elbow: Secondary | ICD-10-CM | POA: Diagnosis present

## 2013-11-19 DIAGNOSIS — E780 Pure hypercholesterolemia: Secondary | ICD-10-CM | POA: Diagnosis not present

## 2013-11-19 DIAGNOSIS — M7022 Olecranon bursitis, left elbow: Secondary | ICD-10-CM | POA: Diagnosis not present

## 2013-11-19 MED ORDER — DOXYCYCLINE HYCLATE 100 MG PO TABS
100.0000 mg | ORAL_TABLET | Freq: Once | ORAL | Status: AC
  Administered 2013-11-19: 100 mg via ORAL
  Filled 2013-11-19: qty 1

## 2013-11-19 MED ORDER — DOXYCYCLINE HYCLATE 100 MG PO CAPS: 100.0000 mg | ORAL_CAPSULE | Freq: Two times a day (BID) | ORAL | Status: DC

## 2013-11-19 MED ORDER — LIDOCAINE HCL (PF) 2 % IJ SOLN
10.0000 mL | Freq: Once | INTRAMUSCULAR | Status: AC
  Administered 2013-11-19: 10 mL
  Filled 2013-11-19: qty 10

## 2013-11-19 NOTE — ED Notes (Signed)
EDP at bedside  

## 2013-11-19 NOTE — Discharge Instructions (Signed)
Bursitis °Bursitis is a swelling and soreness (inflammation) of a fluid-filled sac (bursa) that overlies and protects a joint. It can be caused by injury, overuse of the joint, arthritis or infection. The joints most likely to be affected are the elbows, shoulders, hips and knees. °HOME CARE INSTRUCTIONS  °· Apply ice to the affected area for 15-20 minutes each hour while awake for 2 days. Put the ice in a plastic bag and place a towel between the bag of ice and your skin. °· Rest the injured joint as much as possible, but continue to put the joint through a full range of motion, 4 times per day. (The shoulder joint especially becomes rapidly "frozen" if not used.) When the pain lessens, begin normal slow movements and usual activities. °· Only take over-the-counter or prescription medicines for pain, discomfort or fever as directed by your caregiver. °· Your caregiver may recommend draining the bursa and injecting medicine into the bursa. This may help the healing process. °· Follow all instructions for follow-up with your caregiver. This includes any orthopedic referrals, physical therapy and rehabilitation. Any delay in obtaining necessary care could result in a delay or failure of the bursitis to heal and chronic pain. °SEEK IMMEDIATE MEDICAL CARE IF:  °· Your pain increases even during treatment. °· You develop an oral temperature above 102° F (38.9° C) and have heat and inflammation over the involved bursa. °MAKE SURE YOU:  °· Understand these instructions. °· Will watch your condition. °· Will get help right away if you are not doing well or get worse. °Document Released: 12/17/1999 Document Revised: 03/13/2011 Document Reviewed: 03/10/2013 °ExitCare® Patient Information ©2015 [employer]. This information is not intended to replace advice given to you by your health care provider. Make sure you discuss any questions you have with your health care provider. ° °

## 2013-11-19 NOTE — ED Provider Notes (Signed)
CSN: [id number]     Arrival date & time 11/19/13  1713 History   This chart was scribe for Orpah Greek, * by Judithann Sauger, ED Scribe. The patient was seen in room [facility] and the patient's care was started at 7:05 PM.   Chief Complaint  Patient presents with  . Joint Swelling    The history is provided by the patient. No language interpreter was used.   HPI Comments: Maria Price is a 53 y.o. female who presents to the Emergency Department complaining of the gradually worsening swelling of the left elbow onset 4 hours ago. She reports associated pain with redness and "hotness" to the area. She denies any injuries but states that she does activities that causes her to rest her elbow against a surface for a long period of time. No alleviating factors noted.  Past Medical History  Diagnosis Date  . Hypertension   . Headache(784.0)     History of migraines  . Hypothyroidism   . Hypercholesteremia    Past Surgical History  Procedure Laterality Date  . Tonsillectomy  age 58  . Tubal ligation  2002  . Dilation and curettage of uterus  1989  . Knee arthroscopy w/ orif Right   . Gland removed from neck    . Hysteroscopy, d&c, endometrial ablation  2006  . Colonoscopy    . Colonoscopy N/A 04/09/2012    Procedure: COLONOSCOPY;  Surgeon: Jamesetta So, MD;  Location: [facility];  Service: Gastroenterology;  Laterality: N/A;  . Colonoscopy  04-09-12   Family History  Problem Relation Age of Onset  . Colon cancer Mother   . COPD Mother   . Congestive Heart Failure Father   . Hypertension Father   . Thyroid disease Father   . Migraines Father   . Diabetes Brother   . Migraines Paternal Uncle   . Diabetes Paternal Grandmother    History  Substance Use Topics  . Smoking status: Never Smoker   . Smokeless tobacco: Never Used  . Alcohol Use: 0.6 oz/week    1 Glasses of wine per week     Comment: occ.   OB History    No data available     Review of Systems   Constitutional: Negative for fever and chills.  Gastrointestinal: Negative for nausea, vomiting and diarrhea.  Musculoskeletal: Positive for joint swelling (left elbow).  Skin: Positive for color change (Redness).  All other systems reviewed and are negative.     Allergies  Azithromycin; Levofloxacin; and Sulfa drugs cross reactors  Home Medications   Prior to Admission medications   Medication Sig Start Date End Date Taking? Authorizing Provider  bisoprolol-hydrochlorothiazide (ZIAC) 5-6.25 MG per tablet Take 1 tablet by mouth daily.   Yes Historical Provider, MD  levothyroxine (SYNTHROID, LEVOTHROID) 75 MCG tablet Take 75 mcg by mouth daily before breakfast.   Yes Historical Provider, MD  nortriptyline (PAMELOR) 10 MG capsule TAKE 2 CAPSULES AT BEDTIME. 10/21/13  Yes Marcial Pacas, MD  simvastatin (ZOCOR) 10 MG tablet Take 10 mg by mouth every evening.   Yes Historical Provider, MD  doxycycline (VIBRAMYCIN) 100 MG capsule Take 1 capsule (100 mg total) by mouth 2 (two) times daily. 11/19/13   Orpah Greek, MD  fish oil-omega-3 fatty acids 1000 MG capsule Take 1 g by mouth 2 (two) times daily.    Historical Provider, MD  SUMAtriptan (IMITREX) 100 MG tablet Take 1 tablet (100 mg total) by mouth every 2 (two) hours as needed  for migraine. May repeat in 2 hours if headache persists or recurs. 10/16/12   Dennie Bible, NP   BP 142/79 mmHg  Pulse 81  Temp(Src) 97.4 F (36.3 C) (Oral)  Resp 19  Ht 5\' 7"  (1.702 m)  Wt 192 lb (87.091 kg)  BMI 30.06 kg/m2  SpO2 100% Physical Exam  Constitutional: She is oriented to person, place, and time. She appears well-developed and well-nourished. No distress.  HENT:  Head: Normocephalic and atraumatic.  Right Ear: Hearing normal.  Left Ear: Hearing normal.  Nose: Nose normal.  Mouth/Throat: Oropharynx is clear and moist and mucous membranes are normal.  Eyes: Conjunctivae and EOM are normal. Pupils are equal, round, and reactive  to light.  Neck: Normal range of motion. Neck supple.  Cardiovascular: Regular rhythm, S1 normal and S2 normal.  Exam reveals no gallop and no friction rub.   No murmur heard. Pulmonary/Chest: Effort normal and breath sounds normal. No respiratory distress. She exhibits no tenderness.  Abdominal: Soft. Normal appearance and bowel sounds are normal. There is no hepatosplenomegaly. There is no tenderness. There is no rebound, no guarding, no tenderness at McBurney's point and negative Murphy's sign. No hernia.  Musculoskeletal: Normal range of motion.       Left elbow: She exhibits swelling.  Slightly tender, fluctuant, erythematous region over olecranon process of the elbow  Neurological: She is alert and oriented to person, place, and time. She has normal strength. No cranial nerve deficit or sensory deficit. Coordination normal. GCS eye subscore is 4. GCS verbal subscore is 5. GCS motor subscore is 6.  Skin: Skin is warm, dry and intact. No rash noted. There is erythema (olecranon process). No cyanosis.  Psychiatric: She has a normal mood and affect. Her speech is normal and behavior is normal. Thought content normal.  Nursing note and vitals reviewed.   ED Course  Procedures (including critical care time)  Aspiration of Olecranon Bursitis: Prescription benefits were discussed with the patient, she did give verbal consent. Timeout immediately prior to procedure. Area was prepped with Betadine and sterilely draped. Strict sterile procedure was followed. 2% lidocaine was infiltrated into the skin to raise a large wheal to anesthetize the skin. An 18-gauge needle was then introduced into the bursa through the anesthetized skin under constant aspiration. 0.5 mL of fluid was aspirated. This was sent to the lab for culture. Patient tolerated procedure well. No Medications.  DIAGNOSTIC STUDIES: Oxygen Saturation is 100% on RA, normal by my interpretation.    COORDINATION OF CARE: 7:09 PM- Pt  advised of plan for treatment and pt agrees.    Labs Review Labs Reviewed  BODY FLUID CULTURE    Imaging Review Dg Elbow Complete Left  11/19/2013   CLINICAL DATA:  Redness swelling and tenderness starting today. ICD10: R 60.9.  EXAM: LEFT ELBOW - COMPLETE 3+ VIEW  COMPARISON:  None.  FINDINGS: Pre olecranon soft tissue swelling. No acute fracture or dislocation. No joint effusion. Mild osteoarthritis about the elbow.  IMPRESSION: Pre olecranon soft tissue swelling, possibly representing bursitis.   Electronically Signed   By: Abigail Miyamoto M.D.   On: 11/19/2013 19:00     EKG Interpretation None      MDM   Final diagnoses:  Olecranon bursitis of left elbow   Resented to the ER for evaluation of left elbow pain, swelling, redness and warmth. Patient does have an olecranon bursitis present, likely early septic bursitis. Patient does not have any history of diabetes or immunocompromise. Vital  signs are normal. Patient is appropriate for outpatient follow-up. Bursitis was drained with a needle, culture sent. Will be initiated on doxycycline. Patient given precautions to return to the ER if area is worsening, follow-up with orthopedics if no improvement.  I personally performed the services described in this documentation, which was scribed in my presence. The recorded information has been reviewed and is accurate.    Orpah Greek, MD 11/19/13 2002

## 2013-11-19 NOTE — ED Notes (Signed)
Pt. Reports swelling to left elbow. Pt. Reports it started this afternoon. Pt. Denies injury. Scratch noted to elbow. Elbow red and hot to touch.

## 2013-11-19 NOTE — ED Notes (Signed)
Pt states left elbow started aching a few hours ago, noticed swelling and redness 1 hour ago, pt denies injury, states her elbow is hot to the touch and very tender.

## 2013-11-21 ENCOUNTER — Telehealth (HOSPITAL_BASED_OUTPATIENT_CLINIC_OR_DEPARTMENT_OTHER): Payer: Self-pay | Admitting: Emergency Medicine

## 2013-11-22 LAB — BODY FLUID CULTURE

## 2013-11-23 ENCOUNTER — Telehealth (HOSPITAL_COMMUNITY): Payer: Self-pay

## 2013-11-23 NOTE — Telephone Encounter (Signed)
Post ED Visit - Positive Culture Follow-up  Culture report reviewed by antimicrobial stewardship pharmacist: []  Wes Montecito, Pharm.D., BCPS []  Heide Guile, Pharm.D., BCPS [x]  Alycia Rossetti, Pharm.D., BCPS []  [place], Pharm.D., BCPS, AAHIVP []  Legrand [place]o, Pharm.D., BCPS, AAHIVP []  Elicia Lamp, Pharm.D.    Positive body fluid  Culture, Rare MRSA Treated with Doxycycline, organism sensitive to the same and no further patient follow-up is required at this time.  Dortha Kern 11/23/2013, 3:58 AM

## 2013-12-12 ENCOUNTER — Encounter (HOSPITAL_COMMUNITY): Payer: Self-pay | Admitting: *Deleted

## 2013-12-12 NOTE — H&P (Signed)
Maria Price is an 53 y.o. female.   Chief Complaint:LEFT ELBOW PAIN AND SWELLING HPI: PT SEEN EVALUATED IN OFFICE PT WITH RED SWOLLEN LEFT ELBOW HAD ASPIRATED STILL WITH PERSISTENT FLUID AND PAIN HAS BEEN ON ORAL ABX NO PRIOR SURGERY TO LEFT ELBOW HERE FOR SURGERY  Past Medical History  Diagnosis Date  . Hypertension   . Headache(784.0)     History of migraines  . Hypothyroidism   . Hypercholesteremia   . Pneumonia   . Stomach ulcer   . Complication of anesthesia     slow to wake up    Past Surgical History  Procedure Laterality Date  . Tonsillectomy  age 75  . Tubal ligation  2002  . Dilation and curettage of uterus  1989  . Knee arthroscopy w/ orif Right   . Gland removed from neck    . Hysteroscopy, d&c, endometrial ablation  2006  . Colonoscopy    . Colonoscopy N/A 04/09/2012    Procedure: COLONOSCOPY;  Surgeon: Jamesetta So, MD;  Location: [facility];  Service: Gastroenterology;  Laterality: N/A;  . Colonoscopy  04-09-12    Family History  Problem Relation Age of Onset  . Colon cancer Mother   . COPD Mother   . Congestive Heart Failure Father   . Hypertension Father   . Thyroid disease Father   . Migraines Father   . Diabetes Brother   . Migraines Paternal Uncle   . Diabetes Paternal Grandmother    Social History:  reports that she has never smoked. She has never used smokeless tobacco. She reports that she drinks about 0.6 oz of alcohol per week. She reports that she does not use illicit drugs.  Allergies:  Allergies  Allergen Reactions  . Azithromycin Anaphylaxis  . Levofloxacin Nausea And Vomiting  . Sulfa Drugs Cross Reactors Anaphylaxis    No prescriptions prior to admission    No results found for this or any previous visit (from the past 48 hour(s)). No results found.  ROS NO RECENT ILLNESSES OR HOSPITALIZATIONS  There were no vitals taken for this visit. Physical Exam  General Appearance:  Alert, cooperative, no distress, appears  stated age  Head:  Normocephalic, without obvious abnormality, atraumatic  Eyes:  Pupils equal, conjunctiva/corneas clear,         Throat: Lips, mucosa, and tongue normal; teeth and gums normal  Neck: No visible masses     Lungs:   respirations unlabored  Chest Wall:  No tenderness or deformity  Heart:  Regular rate and rhythm,  Abdomen:   Soft, non-tender,         Extremities: LEFT ELBOW: PAINFUL AND SWOLLEN LEFT OLECRANON BURSA LIMITED ELBOW MOVEMENT FLEXION AND EXTENSION GOOD FOREARM ROTATION FINGERS WARM WELL PERFUSED NVI LEFT HAND  Pulses: 2+ and symmetric  Skin: Skin color, texture, turgor normal, no rashes or lesions     Neurologic: Normal   Assessment/Plan LEFT ELBOW OLECRANON INFECTED BURSITIS  LEFT ELBOW OLECRANON BURSECTOMY AND DRAINAGE  R/B/A DISCUSSED WITH PT IN OFFICE.  PT VOICED UNDERSTANDING OF PLAN CONSENT SIGNED DAY OF SURGERY PT SEEN AND EXAMINED PRIOR TO OPERATIVE PROCEDURE/DAY OF SURGERY SITE MARKED. QUESTIONS ANSWERED WILL LIKELY GO HOME FOLLOWING SURGERY  WE ARE PLANNING SURGERY FOR YOUR UPPER EXTREMITY. THE RISKS AND BENEFITS OF SURGERY INCLUDE BUT NOT LIMITED TO BLEEDING INFECTION, DAMAGE TO NEARBY NERVES ARTERIES TENDONS, FAILURE OF SURGERY TO ACCOMPLISH ITS INTENDED GOALS, PERSISTENT SYMPTOMS AND NEED FOR FURTHER SURGICAL INTERVENTION. WITH THIS IN MIND WE WILL  PROCEED. I HAVE DISCUSSED WITH THE PATIENT THE PRE AND POSTOPERATIVE REGIMEN AND THE DOS AND DON'TS. PT VOICED UNDERSTANDING AND INFORMED CONSENT SIGNED.  Maria Price 12/13/2013 AT 0945

## 2013-12-12 NOTE — Brief Op Note (Signed)
12/13/2013  5:39 PM  PATIENT:  Maria Price  53 y.o. female  PRE-OPERATIVE DIAGNOSIS:  left elbow olceranon bursitis  POST-OPERATIVE DIAGNOSIS: same  PROCEDURE:  Procedure(s): LEFT ELBOW OLECRANON BURSA (Left)  SURGEON:  Surgeon(s) and Role:    * [name], MD - Primary  PHYSICIAN ASSISTANT: none  ASSISTANTS: none   ANESTHESIA:   general  EBL:     BLOOD ADMINISTERED:none  DRAINS: none   LOCAL MEDICATIONS USED:  MARCAINE     SPECIMEN:  No Specimen  DISPOSITION OF SPECIMEN:  N/A  COUNTS:  YES  TOURNIQUET:  * No tourniquets in log *  DICTATION: .Other Dictation: Dictation Number [id number]  PLAN OF CARE: Discharge to home after PACU  PATIENT DISPOSITION:  PACU - hemodynamically stable.   Delay start of Pharmacological VTE agent (>24hrs) due to surgical blood loss or risk of bleeding: not applicable

## 2013-12-13 ENCOUNTER — Encounter (HOSPITAL_COMMUNITY)
Admission: RE | Disposition: A | Discharge status: Home / Self Care | Payer: Self-pay | Source: Ambulatory Visit | Attending: Orthopedic Surgery

## 2013-12-13 ENCOUNTER — Ambulatory Visit (HOSPITAL_COMMUNITY)
Admission: RE | Admit: 2013-12-13 | Discharge: 2013-12-13 | Disposition: A | Discharge status: Home / Self Care | Payer: No Typology Code available for payment source | Source: Ambulatory Visit | Attending: Orthopedic Surgery | Admitting: Orthopedic Surgery

## 2013-12-13 ENCOUNTER — Ambulatory Visit (HOSPITAL_COMMUNITY): Payer: No Typology Code available for payment source | Admitting: Certified Registered Nurse Anesthetist

## 2013-12-13 ENCOUNTER — Encounter (HOSPITAL_COMMUNITY): Payer: Self-pay | Admitting: *Deleted

## 2013-12-13 DIAGNOSIS — E039 Hypothyroidism, unspecified: Secondary | ICD-10-CM | POA: Diagnosis not present

## 2013-12-13 DIAGNOSIS — G43909 Migraine, unspecified, not intractable, without status migrainosus: Secondary | ICD-10-CM | POA: Diagnosis not present

## 2013-12-13 DIAGNOSIS — M7022 Olecranon bursitis, left elbow: Secondary | ICD-10-CM | POA: Insufficient documentation

## 2013-12-13 DIAGNOSIS — I10 Essential (primary) hypertension: Secondary | ICD-10-CM | POA: Insufficient documentation

## 2013-12-13 DIAGNOSIS — E780 Pure hypercholesterolemia: Secondary | ICD-10-CM | POA: Diagnosis not present

## 2013-12-13 HISTORY — DX: Gastric ulcer, unspecified as acute or chronic, without hemorrhage or perforation: K25.9

## 2013-12-13 HISTORY — DX: Pneumonia, unspecified organism: J18.9

## 2013-12-13 HISTORY — DX: Adverse effect of unspecified anesthetic, initial encounter: T41.45XA

## 2013-12-13 HISTORY — PX: OLECRANON BURSECTOMY: SHX2097

## 2013-12-13 HISTORY — DX: Other complications of anesthesia, initial encounter: T88.59XA

## 2013-12-13 LAB — CBC
HCT: 40.5 % (ref 36.0–46.0)
Hemoglobin: 13.8 g/dL (ref 12.0–15.0)
MCH: 29.9 pg (ref 26.0–34.0)
MCHC: 34.1 g/dL (ref 30.0–36.0)
MCV: 87.9 fL (ref 78.0–100.0)
Platelets: 245 10*3/uL (ref 150–400)
RBC: 4.61 MIL/uL (ref 3.87–5.11)
RDW: 12.3 % (ref 11.5–15.5)
WBC: 5.6 10*3/uL (ref 4.0–10.5)

## 2013-12-13 LAB — BASIC METABOLIC PANEL
Anion gap: 13 (ref 5–15)
BUN: 13 mg/dL (ref 6–23)
CALCIUM: 9.8 mg/dL (ref 8.4–10.5)
CO2: 25 mEq/L (ref 19–32)
Chloride: 103 mEq/L (ref 96–112)
Creatinine, Ser: 0.69 mg/dL (ref 0.50–1.10)
GLUCOSE: 93 mg/dL (ref 70–99)
Potassium: 4.3 mEq/L (ref 3.7–5.3)
SODIUM: 141 meq/L (ref 137–147)

## 2013-12-13 LAB — SURGICAL PCR SCREEN
MRSA, PCR: NEGATIVE
STAPHYLOCOCCUS AUREUS: NEGATIVE

## 2013-12-13 SURGERY — BURSECTOMY, ELBOW
Anesthesia: General | Site: Elbow | Laterality: Left

## 2013-12-13 MED ORDER — FENTANYL CITRATE 0.05 MG/ML IJ SOLN
INTRAMUSCULAR | Status: AC
  Filled 2013-12-13: qty 5

## 2013-12-13 MED ORDER — ONDANSETRON HCL 4 MG PO TABS: 4.0000 mg | ORAL_TABLET | Freq: Three times a day (TID) | ORAL | Status: DC | PRN

## 2013-12-13 MED ORDER — OXYCODONE-ACETAMINOPHEN 5-325 MG PO TABS
ORAL_TABLET | ORAL | Status: AC
  Administered 2013-12-13: 2 via ORAL
  Filled 2013-12-13: qty 2

## 2013-12-13 MED ORDER — LIDOCAINE HCL (CARDIAC) 20 MG/ML IV SOLN
INTRAVENOUS | Status: AC
  Filled 2013-12-13: qty 5

## 2013-12-13 MED ORDER — HYDROMORPHONE HCL 1 MG/ML IJ SOLN
INTRAMUSCULAR | Status: AC
  Administered 2013-12-13: 0.5 mg via INTRAVENOUS
  Filled 2013-12-13: qty 1

## 2013-12-13 MED ORDER — LIDOCAINE HCL (CARDIAC) 20 MG/ML IV SOLN
INTRAVENOUS | Status: DC | PRN
  Administered 2013-12-13: 70 mg via INTRAVENOUS

## 2013-12-13 MED ORDER — LACTATED RINGERS IV SOLN
INTRAVENOUS | Status: DC | PRN
  Administered 2013-12-13: 10:00:00 via INTRAVENOUS

## 2013-12-13 MED ORDER — ONDANSETRON HCL 4 MG/2ML IJ SOLN
INTRAMUSCULAR | Status: DC | PRN
  Administered 2013-12-13: 4 mg via INTRAVENOUS

## 2013-12-13 MED ORDER — LACTATED RINGERS IV SOLN
INTRAVENOUS | Status: DC
  Administered 2013-12-13: 08:00:00 via INTRAVENOUS

## 2013-12-13 MED ORDER — SUCCINYLCHOLINE CHLORIDE 20 MG/ML IJ SOLN
INTRAMUSCULAR | Status: AC
  Filled 2013-12-13: qty 1

## 2013-12-13 MED ORDER — DOXYCYCLINE HYCLATE 100 MG PO TABS: 100.0000 mg | ORAL_TABLET | Freq: Two times a day (BID) | ORAL | Status: DC

## 2013-12-13 MED ORDER — CHLORHEXIDINE GLUCONATE 4 % EX LIQD
60.0000 mL | Freq: Once | CUTANEOUS | Status: DC
  Filled 2013-12-13: qty 60

## 2013-12-13 MED ORDER — EPHEDRINE SULFATE 50 MG/ML IJ SOLN
INTRAMUSCULAR | Status: DC | PRN
  Administered 2013-12-13 (×3): 10 mg via INTRAVENOUS

## 2013-12-13 MED ORDER — PROPOFOL 10 MG/ML IV BOLUS
INTRAVENOUS | Status: DC | PRN
  Administered 2013-12-13: 200 mg via INTRAVENOUS

## 2013-12-13 MED ORDER — ONDANSETRON HCL 4 MG/2ML IJ SOLN
INTRAMUSCULAR | Status: AC
  Administered 2013-12-13: 4 mg
  Filled 2013-12-13: qty 2

## 2013-12-13 MED ORDER — ROCURONIUM BROMIDE 50 MG/5ML IV SOLN
INTRAVENOUS | Status: AC
  Filled 2013-12-13: qty 1

## 2013-12-13 MED ORDER — PROPOFOL 10 MG/ML IV BOLUS
INTRAVENOUS | Status: AC
  Filled 2013-12-13: qty 20

## 2013-12-13 MED ORDER — MIDAZOLAM HCL 5 MG/5ML IJ SOLN
INTRAMUSCULAR | Status: DC | PRN
  Administered 2013-12-13: 1 mg via INTRAVENOUS

## 2013-12-13 MED ORDER — OXYCODONE-ACETAMINOPHEN 5-325 MG PO TABS: 1.0000 | ORAL_TABLET | ORAL | Status: DC | PRN

## 2013-12-13 MED ORDER — BUPIVACAINE HCL (PF) 0.25 % IJ SOLN
INTRAMUSCULAR | Status: DC | PRN
  Administered 2013-12-13: 10 mL

## 2013-12-13 MED ORDER — FENTANYL CITRATE 0.05 MG/ML IJ SOLN
INTRAMUSCULAR | Status: DC | PRN
  Administered 2013-12-13: 50 ug via INTRAVENOUS

## 2013-12-13 MED ORDER — MUPIROCIN 2 % EX OINT
1.0000 "application " | TOPICAL_OINTMENT | Freq: Once | CUTANEOUS | Status: AC
  Administered 2013-12-13: 1 via TOPICAL
  Filled 2013-12-13: qty 22

## 2013-12-13 MED ORDER — MIDAZOLAM HCL 2 MG/2ML IJ SOLN
INTRAMUSCULAR | Status: AC
  Filled 2013-12-13: qty 2

## 2013-12-13 MED ORDER — SODIUM CHLORIDE 0.9 % IJ SOLN
INTRAMUSCULAR | Status: AC
  Filled 2013-12-13: qty 10

## 2013-12-13 MED ORDER — HYDROMORPHONE HCL 1 MG/ML IJ SOLN
0.2500 mg | INTRAMUSCULAR | Status: DC | PRN
  Administered 2013-12-13 (×4): 0.5 mg via INTRAVENOUS

## 2013-12-13 MED ORDER — BUPIVACAINE-EPINEPHRINE (PF) 0.25% -1:200000 IJ SOLN
INTRAMUSCULAR | Status: AC
  Filled 2013-12-13: qty 30

## 2013-12-13 MED ORDER — DOCUSATE SODIUM 100 MG PO CAPS: 100.0000 mg | ORAL_CAPSULE | Freq: Two times a day (BID) | ORAL | Status: DC

## 2013-12-13 MED ORDER — OXYCODONE-ACETAMINOPHEN 5-325 MG PO TABS
1.0000 | ORAL_TABLET | Freq: Once | ORAL | Status: AC
  Administered 2013-12-13: 2 via ORAL

## 2013-12-13 MED ORDER — VANCOMYCIN HCL IN DEXTROSE 1-5 GM/200ML-% IV SOLN
1000.0000 mg | INTRAVENOUS | Status: AC
  Administered 2013-12-13: 1000 mg via INTRAVENOUS
  Filled 2013-12-13: qty 200

## 2013-12-13 MED ORDER — EPHEDRINE SULFATE 50 MG/ML IJ SOLN
INTRAMUSCULAR | Status: AC
  Filled 2013-12-13: qty 1

## 2013-12-13 SURGICAL SUPPLY — 47 items
BANDAGE ELASTIC 3 VELCRO ST LF (GAUZE/BANDAGES/DRESSINGS) IMPLANT
BANDAGE ELASTIC 4 VELCRO ST LF (GAUZE/BANDAGES/DRESSINGS) IMPLANT
BNDG GAUZE ELAST 4 BULKY (GAUZE/BANDAGES/DRESSINGS) ×2 IMPLANT
CANISTER SUCTION 2500CC (MISCELLANEOUS) ×3 IMPLANT
CORDS BIPOLAR (ELECTRODE) ×3 IMPLANT
COVER SURGICAL LIGHT HANDLE (MISCELLANEOUS) ×3 IMPLANT
CUFF TOURNIQUET SINGLE 18IN (TOURNIQUET CUFF) ×3 IMPLANT
CUFF TOURNIQUET SINGLE 24IN (TOURNIQUET CUFF) IMPLANT
DRAPE SURG 17X23 STRL (DRAPES) ×3 IMPLANT
DRSG ADAPTIC 3X8 NADH LF (GAUZE/BANDAGES/DRESSINGS) IMPLANT
DRSG EMULSION OIL 3X3 NADH (GAUZE/BANDAGES/DRESSINGS) ×2 IMPLANT
GAUZE SPONGE 4X4 12PLY STRL (GAUZE/BANDAGES/DRESSINGS) IMPLANT
GLOVE BIOGEL PI IND STRL 6.5 (GLOVE) IMPLANT
GLOVE BIOGEL PI IND STRL 8.5 (GLOVE) ×1 IMPLANT
GLOVE BIOGEL PI INDICATOR 6.5 (GLOVE) ×4
GLOVE BIOGEL PI INDICATOR 8.5 (GLOVE) ×2
GLOVE SURG ORTHO 8.0 STRL STRW (GLOVE) ×3 IMPLANT
GOWN STRL REUS W/ TWL LRG LVL3 (GOWN DISPOSABLE) ×2 IMPLANT
GOWN STRL REUS W/ TWL XL LVL3 (GOWN DISPOSABLE) ×1 IMPLANT
GOWN STRL REUS W/TWL LRG LVL3 (GOWN DISPOSABLE) ×6
GOWN STRL REUS W/TWL XL LVL3 (GOWN DISPOSABLE) ×3
KIT BASIN OR (CUSTOM PROCEDURE TRAY) ×3 IMPLANT
KIT ROOM TURNOVER OR (KITS) ×3 IMPLANT
NDL HYPO 25GX1X1/2 BEV (NEEDLE) IMPLANT
NEEDLE HYPO 25GX1X1/2 BEV (NEEDLE) IMPLANT
NS IRRIG 1000ML POUR BTL (IV SOLUTION) ×3 IMPLANT
PACK ORTHO EXTREMITY (CUSTOM PROCEDURE TRAY) ×3 IMPLANT
PAD ARMBOARD 7.5X6 YLW CONV (MISCELLANEOUS) ×6 IMPLANT
PAD CAST 4YDX4 CTTN HI CHSV (CAST SUPPLIES) IMPLANT
PADDING CAST ABS 4INX4YD NS (CAST SUPPLIES) ×2
PADDING CAST ABS COTTON 4X4 ST (CAST SUPPLIES) IMPLANT
PADDING CAST COTTON 4X4 STRL (CAST SUPPLIES)
SOAP 2 % CHG 4 OZ (WOUND CARE) ×3 IMPLANT
SPECIMEN JAR SMALL (MISCELLANEOUS) ×3 IMPLANT
SPONGE GAUZE 4X4 12PLY STER LF (GAUZE/BANDAGES/DRESSINGS) ×2 IMPLANT
SUT PROLENE 3 0 PS 2 (SUTURE) ×2 IMPLANT
SUT PROLENE 4 0 PS 2 18 (SUTURE) IMPLANT
SUT VIC AB 2-0 CT1 27 (SUTURE)
SUT VIC AB 2-0 CT1 TAPERPNT 27 (SUTURE) IMPLANT
SYR CONTROL 10ML LL (SYRINGE) IMPLANT
TOWEL OR 17X24 6PK STRL BLUE (TOWEL DISPOSABLE) ×3 IMPLANT
TOWEL OR 17X26 10 PK STRL BLUE (TOWEL DISPOSABLE) ×3 IMPLANT
TUBE ANAEROBIC SPECIMEN COL (MISCELLANEOUS) ×2 IMPLANT
TUBE CONNECTING 12'X1/4 (SUCTIONS)
TUBE CONNECTING 12X1/4 (SUCTIONS) IMPLANT
UNDERPAD 30X30 INCONTINENT (UNDERPADS AND DIAPERS) ×3 IMPLANT
WATER STERILE IRR 1000ML POUR (IV SOLUTION) ×3 IMPLANT

## 2013-12-13 NOTE — Anesthesia Preprocedure Evaluation (Addendum)
Anesthesia Evaluation  Patient identified by MRN, date of birth, ID band Patient awake    Reviewed: Allergy & Precautions, H&P , NPO status   History of Anesthesia Complications (+) PROLONGED EMERGENCE and history of anesthetic complications  Airway Mallampati: II  TM Distance: >3 FB Neck ROM: Full    Dental  (+) Teeth Intact, Dental Advisory Given   Pulmonary pneumonia -,  breath sounds clear to auscultation        Cardiovascular hypertension, Rhythm:Regular Rate:Normal     Neuro/Psych  Headaches,    GI/Hepatic PUD,   Endo/Other  Hypothyroidism   Renal/GU      Musculoskeletal   Abdominal   Peds  Hematology   Anesthesia Other Findings   Reproductive/Obstetrics                            Anesthesia Physical Anesthesia Plan  ASA: II  Anesthesia Plan: General   Post-op Pain Management:    Induction: Intravenous  Airway Management Planned: LMA  Additional Equipment:   Intra-op Plan:   Post-operative Plan: Extubation in OR  Informed Consent: I have reviewed the patients History and Physical, chart, labs and discussed the procedure including the risks, benefits and alternatives for the proposed anesthesia with the patient or authorized representative who has indicated his/her understanding and acceptance.   Dental advisory given  Plan Discussed with: Anesthesiologist, Surgeon and CRNA  Anesthesia Plan Comments:        Anesthesia Quick Evaluation

## 2013-12-13 NOTE — Op Note (Signed)
NAMEVELISA, Maria Price NO.:  [id number]  MEDICAL RECORD NO.:  [id number]  LOCATION:  [facility]                         FACILITY:  [facility]  PHYSICIAN:  Melrose Nakayama, MD  DATE OF BIRTH:  02/23/1960  DATE OF PROCEDURE:  12/13/2013 DATE OF DISCHARGE:                              OPERATIVE REPORT   PREOPERATIVE DIAGNOSIS:  Left elbow olecranon bursitis.  POSTOPERATIVE DIAGNOSIS:  Left elbow olecranon bursitis.  ATTENDING PHYSICIAN:  Melrose Nakayama, MD; who scrubbed and was present for the entire procedure.  ASSISTANT SURGEON:  None.  SURGICAL PROCEDURE:  Left elbow olecranon bursectomy.  SURGICAL INDICATIONS:  [name] is a right-hand-dominant female with persistent red, hot, swollen left elbow.  The patient had an aspirate which grew out MRSA.  The patient elected to undergo the above procedure.  Risks, benefits, and alternatives were discussed in detail with the patient.  Signed informed consent was obtained.  Risks include but not limited to bleeding, infection, damage to nearby nerves, arteries, or tendons, loss of motion of wrist and digits, persistent infection, and need for further surgical intervention.  DESCRIPTION OF PROCEDURE:  The patient was properly identified in the preoperative holding area and marked with a permanent marker on the left elbow to indicate the correct operative site.  The patient was then brought back to the operating room, placed supine on the Anesthesia room table.  General anesthesia was administered.  The patient received preoperative antibiotics prior to skin incision.  A well-padded tourniquet was placed on left brachium, sealed with 1000 drape.  Left upper extremity was then prepped and draped in normal sterile fashion. Time-out was called, correct side was identified, and procedure then begun.  Attention was then turned to the left elbow where a curvilinear incision was made radially over the olecranon tip.  Limb was  elevated and tourniquet was insufflated.  Dissection was carried down through the skin and subcutaneous tissue.  Circumferential dissection was then carried down around the infected olecranon bursa.  There was a mild amount of purulence more clear drainage but did have a mild exudate present.  After circumferential dissection of the bursa, the wound was then thoroughly irrigated.  Hemostasis was obtained with bipolar cautery.  A 10 mL of 0.25% Marcaine was infiltrated with epinephrine. The wound was then loosely reapproximated with Prolene sutures.  Adaptic dressing, sterile compressive bandage was then applied.  The patient was then placed in a well-padded long-arm splint keeping her in slight extension, extubated and taken to recovery room in good condition.  POSTOPERATIVE PLAN:  The patient was discharged to home, will be seen back in the office in approximately 1 week for wound check.  Continue on oral antibiotics.  I will plan to see her back in approximately 3 days for wound check and then back into the splint.  Continue soft tissue immobilization until better wound healing.  SPECIMENS:  Olecranon bursa to Pathology for culture.     Melrose Nakayama, MD     FWO/MEDQ  D:  12/13/2013  T:  12/13/2013  Job:  [id number]

## 2013-12-13 NOTE — Transfer of Care (Signed)
Immediate Anesthesia Transfer of Care Note  Patient: Maria Price  Procedure(s) Performed: Procedure(s): LEFT ELBOW OLECRANON BURSA (Left)  Patient Location: PACU  Anesthesia Type:General  Level of Consciousness: awake, alert  and oriented  Airway & Oxygen Therapy: Patient Spontanous Breathing and Patient connected to nasal cannula oxygen  Post-op Assessment: Report given to PACU RN and Post -op Vital signs reviewed and stable  Post vital signs: Reviewed and stable  Complications: No apparent anesthesia complications

## 2013-12-13 NOTE — Anesthesia Procedure Notes (Signed)
Procedure Name: LMA Insertion Date/Time: 12/13/2013 10:14 AM Performed by: Susa Loffler Pre-anesthesia Checklist: Patient identified, Timeout performed, Emergency Drugs available, Suction available and Patient being monitored Patient Re-evaluated:Patient Re-evaluated prior to inductionOxygen Delivery Method: Circle system utilized Preoxygenation: Pre-oxygenation with 100% oxygen Intubation Type: IV induction Ventilation: Mask ventilation without difficulty LMA: LMA inserted LMA Size: 4.0 Number of attempts: 1 Placement Confirmation: positive ETCO2 and breath sounds checked- equal and bilateral Tube secured with: Tape Dental Injury: Teeth and Oropharynx as per pre-operative assessment

## 2013-12-13 NOTE — Anesthesia Postprocedure Evaluation (Signed)
  Anesthesia Post-op Note  Patient: Maria Price  Procedure(s) Performed: Procedure(s): LEFT ELBOW OLECRANON BURSA (Left)  Patient Location: PACU  Anesthesia Type:General  Level of Consciousness: awake  Airway and Oxygen Therapy: Patient Spontanous Breathing  Post-op Pain: mild  Post-op Assessment: Post-op Vital signs reviewed  Post-op Vital Signs: Reviewed  Last Vitals:  Filed Vitals:   12/13/13 1215  BP: 134/84  Pulse: 81  Temp:   Resp: 16    Complications: No apparent anesthesia complications

## 2013-12-13 NOTE — Discharge Instructions (Signed)
KEEP BANDAGE CLEAN AND DRY CALL OFFICE FOR F/U APPT [phone] in 4 days DR [facility] CELL [phone] KEEP HAND ELEVATED ABOVE HEART OK TO APPLY ICE TO OPERATIVE AREA CONTACT OFFICE IF ANY WORSENING PAIN OR CONCERNS.

## 2013-12-15 ENCOUNTER — Encounter (HOSPITAL_COMMUNITY): Payer: Self-pay | Admitting: Orthopedic Surgery

## 2013-12-18 LAB — TISSUE CULTURE
CULTURE: NO GROWTH
CULTURE: NO GROWTH

## 2013-12-19 LAB — ANAEROBIC CULTURE

## 2014-01-06 ENCOUNTER — Ambulatory Visit (INDEPENDENT_AMBULATORY_CARE_PROVIDER_SITE_OTHER): Payer: No Typology Code available for payment source | Admitting: Nurse Practitioner

## 2014-01-06 ENCOUNTER — Encounter: Payer: Self-pay | Admitting: Nurse Practitioner

## 2014-01-06 VITALS — BP 144/86 | HR 82 | Temp 98.0°F | Resp 16 | Ht 67.0 in | Wt 187.8 lb

## 2014-01-06 DIAGNOSIS — G43009 Migraine without aura, not intractable, without status migrainosus: Secondary | ICD-10-CM

## 2014-01-06 MED ORDER — SUMATRIPTAN SUCCINATE 100 MG PO TABS: 100.0000 mg | ORAL_TABLET | ORAL | Status: DC | PRN

## 2014-01-06 MED ORDER — NORTRIPTYLINE HCL 10 MG PO CAPS: ORAL_CAPSULE | ORAL | Status: DC

## 2014-01-06 NOTE — Patient Instructions (Signed)
Continue nortriptyline as preventive and Imitrex acutely will refill both Follow-up yearly and when necessary

## 2014-01-06 NOTE — Progress Notes (Signed)
[facility]  PATIENT: Maria Price DOB: 16-Jun-1960   REASON FOR VISIT: Follow-up for migraine   HISTORY OF PRESENT ILLNESS:[name], 54 year old returns for followup. She has a history of migraines for as long as she can remember. Her typical migraine is right retro-orbital severe piercing pain lasting 30 minutes with associated photophobia and phonophobia relieved by lying in a dark quiet room. She denies motor or sensory deficits. Her triggers can be weather changes lack of sleep perfumes and windy weather. She currently goes several months without a headache and then they have several in a row. Her last headache was over a month ago. She is currently taking nortriptyline as a preventive and Imitrex as needed. She claims she is doing well. She returns for reevaluation and refills  REVIEW OF SYSTEMS: Full 14 system review of systems performed and notable only for those listed, all others are neg:  Constitutional: N/A  Cardiovascular: N/A  Ear/Nose/Throat: N/A  Skin: N/A  Eyes: N/A  Respiratory: N/A  Gastroitestinal: N/A  Hematology/Lymphatic: N/A  Endocrine: N/A Musculoskeletal:N/A  Allergy/Immunology: N/A  Neurological: N/A Psychiatric: N/A Sleep : NA   ALLERGIES: Allergies  Allergen Reactions  . Azithromycin Anaphylaxis  . Levofloxacin Nausea And Vomiting  . Sulfa Drugs Cross Reactors Anaphylaxis    HOME MEDICATIONS: Outpatient Prescriptions Prior to Visit  Medication Sig Dispense Refill  . bisoprolol-hydrochlorothiazide (ZIAC) 5-6.25 MG per tablet Take 1 tablet by mouth daily.    Marland Kitchen doxycycline (VIBRAMYCIN) 100 MG capsule Take 1 capsule (100 mg total) by mouth 2 (two) times daily. (Patient taking differently: Take 100 mg by mouth 3 (three) times daily. ) 20 capsule 0  . levothyroxine (SYNTHROID, LEVOTHROID) 75 MCG tablet Take 75 mcg by mouth daily before breakfast.    . nortriptyline (PAMELOR) 10 MG capsule TAKE 2 CAPSULES AT BEDTIME. 60 capsule 3   . Propylene Glycol (SYSTANE BALANCE) 0.6 % SOLN Apply 1 drop to eye every morning.    . SUMAtriptan (IMITREX) 100 MG tablet Take 1 tablet (100 mg total) by mouth every 2 (two) hours as needed for migraine. May repeat in 2 hours if headache persists or recurs. 9 tablet 6  . docusate sodium (COLACE) 100 MG capsule Take 1 capsule (100 mg total) by mouth 2 (two) times daily. 30 capsule 0  . doxycycline (VIBRA-TABS) 100 MG tablet Take 1 tablet (100 mg total) by mouth 2 (two) times daily. 20 tablet 0  . ondansetron (ZOFRAN) 4 MG tablet Take 1 tablet (4 mg total) by mouth every 8 (eight) hours as needed for nausea or vomiting. 20 tablet 0  . oxyCODONE-acetaminophen (ROXICET) 5-325 MG per tablet Take 1 tablet by mouth every 4 (four) hours as needed for severe pain. 45 tablet 0  . simvastatin (ZOCOR) 10 MG tablet Take 10 mg by mouth every evening.     No facility-administered medications prior to visit.    PAST MEDICAL HISTORY: Past Medical History  Diagnosis Date  . Hypertension   . Headache(784.0)     History of migraines  . Hypothyroidism   . Hypercholesteremia   . Pneumonia   . Stomach ulcer   . Complication of anesthesia     slow to wake up    PAST SURGICAL HISTORY: Past Surgical History  Procedure Laterality Date  . Tonsillectomy  age 45  . Tubal ligation  2002  . Dilation and curettage of uterus  1989  . Knee arthroscopy w/ orif Right   . Gland removed from neck    .  Hysteroscopy, d&c, endometrial ablation  2006  . Colonoscopy    . Colonoscopy N/A 04/09/2012    Procedure: COLONOSCOPY;  Surgeon: Jamesetta So, MD;  [facility];  Service: Gastroenterology;  Laterality: N/A;  . Colonoscopy  04-09-12  . Olecranon bursectomy Left 12/13/2013    Procedure: LEFT ELBOW OLECRANON BURSA;  Surgeon: [name], MD;  Location: [facility];  Service: Orthopedics;  Laterality: Left;    FAMILY HISTORY: Family History  Problem Relation Age of Onset  . Colon cancer Mother   . COPD  Mother   . Congestive Heart Failure Father   . Hypertension Father   . Thyroid disease Father   . Migraines Father   . Diabetes Brother   . Migraines Paternal Uncle   . Diabetes Paternal Grandmother     SOCIAL HISTORY: History   Social History  . Marital Status: Married    Spouse Name: N/A    Number of Children: N/A  . Years of Education: N/A   Occupational History  . Not on file.   Social History Main Topics  . Smoking status: Never Smoker   . Smokeless tobacco: Never Used  . Alcohol Use: 0.6 oz/week    1 Glasses of wine per week     Comment: occ.  . Drug Use: No  . Sexual Activity: Yes    Birth Control/ Protection: Surgical   Other Topics Concern  . Not on file   Social History Narrative     PHYSICAL EXAM  Filed Vitals:   01/06/14 1523  BP: 144/86  Pulse: 82  Temp: 98 F (36.7 C)  TempSrc: Oral  Resp: 16  Height: 5\' 7"  (1.702 m)  Weight: 187 lb 12.8 oz (85.186 kg)   Body mass index is 29.41 kg/(m^2). Generalized: Well developed, in no acute distress  Head: normocephalic and atraumatic,. Oropharynx benign  Neck: Supple, no carotid bruits  Musculoskeletal: No deformity   Neurological examination   Mentation: Alert oriented to time, place, history taking. Follows all commands speech and language fluent  Cranial nerve II-XII: Pupils were equal round reactive to light extraocular movements were full, visual field were full on confrontational test. Facial sensation and strength were normal. hearing was intact to finger rubbing bilaterally. Uvula tongue midline. head turning and shoulder shrug and were normal and symmetric.Tongue protrusion into cheek strength was normal. Motor: normal bulk and tone, full strength in the BUE, BLE, fine finger movements normal,  No focal weakness Coordination: finger-nose-finger, heel-to-shin bilaterally, no dysmetria Reflexes: Brachioradialis 2/2, biceps 2/2, triceps 2/2, patellar 2/2, Achilles 2/2, plantar responses  were flexor bilaterally. Gait and Station: Rising up from seated position without assistance, normal stance, moderate stride, good arm swing, smooth turning, able to perform tiptoe, and heel walking without difficulty. Tandem gait steady   DIAGNOSTIC DATA (LABS, IMAGING, TESTING) - I reviewed patient records, labs, notes, testing and imaging myself where available.  Lab Results  Component Value Date   WBC 5.6 12/13/2013   HGB 13.8 12/13/2013   HCT 40.5 12/13/2013   MCV 87.9 12/13/2013   PLT 245 12/13/2013      Component Value Date/Time   NA 141 12/13/2013 0654   K 4.3 12/13/2013 0654   CL 103 12/13/2013 0654   CO2 25 12/13/2013 0654   GLUCOSE 93 12/13/2013 0654   BUN 13 12/13/2013 0654   CREATININE 0.69 12/13/2013 0654   CALCIUM 9.8 12/13/2013 0654   PROT 7.1 05/18/2007 1015   ALBUMIN 3.8 05/18/2007 1015  AST 15 05/18/2007 1015   ALT 12 05/18/2007 1015   ALKPHOS 42 05/18/2007 1015   BILITOT 0.5 05/18/2007 1015   GFRNONAA >90 12/13/2013 0654   GFRAA >90 12/13/2013 0654    ASSESSMENT AND PLAN  54 y.o. year old female  has a past medical history of  Headache(784.0); which are well controlled.   Continue nortriptyline as preventive and Imitrex acutely will refill both Follow-up yearly and when necessary Dennie Bible, [facility], [facility], APRN  [facility] [address], [place], [place] [address] [phone]

## 2014-01-09 NOTE — Progress Notes (Signed)
I agree above plan. 

## 2014-01-26 LAB — AFB CULTURE WITH SMEAR (NOT AT ARMC)
Acid Fast Smear: NONE SEEN
Acid Fast Smear: NONE SEEN

## 2014-03-10 ENCOUNTER — Other Ambulatory Visit: Payer: Self-pay

## 2014-03-10 DIAGNOSIS — Z1231 Encounter for screening mammogram for malignant neoplasm of breast: Secondary | ICD-10-CM

## 2014-04-10 ENCOUNTER — Encounter: Payer: Self-pay | Admitting: Obstetrics and Gynecology

## 2014-04-10 ENCOUNTER — Ambulatory Visit (INDEPENDENT_AMBULATORY_CARE_PROVIDER_SITE_OTHER): Payer: No Typology Code available for payment source | Admitting: Obstetrics and Gynecology

## 2014-04-10 VITALS — BP 150/86 | Ht 67.5 in | Wt 187.0 lb

## 2014-04-10 DIAGNOSIS — Z0000 Encounter for general adult medical examination without abnormal findings: Secondary | ICD-10-CM

## 2014-04-10 DIAGNOSIS — Z01419 Encounter for gynecological examination (general) (routine) without abnormal findings: Secondary | ICD-10-CM

## 2014-04-10 NOTE — Progress Notes (Signed)
Patient ID: Maria Price, female   DOB: 1960-02-23, 54 y.o.   MRN: [id number]  This chart was SCRIBED for Mallory Shirk, MD by Stephania Fragmin, ED Scribe. This patient was seen in room 3 and the patient's care was started at 12:43 PM.   Assessment:  Annual Gyn Exam Plan:  1. pap smear not done, next pap due 2 yr 2. return annually or prn 3    Annual mammogram advised 4. Anal soilage without rectocele Subjective:  Maria Price is a 54 y.o. female with a history of hysteroscopy, D&C, and endometrial ablation; and tubal ligation; who presents for annual exam. No LMP recorded. Patient is postmenopausal. The patient has no complaints at this time. Per chart review, her last LNMP was in 2010 and her menses after hysteroscopy were very light. Per chart review, patient has a maternal history of colon cancer and reports polyps on prior colonoscopy.  She has seen her PCP, Dr. Hilma Favors, last week and had blood tests done. She is scheduled to have a 3D mammogram at the [facility] in [place], [place] and ophthalmology appointment in the upcoming 2 weeks.   The following portions of the patient's history were reviewed and updated as appropriate: allergies, current medications, past family history, past medical history, past social history, past surgical history and problem list.  Past Medical History  Diagnosis Date  . Hypertension   . Headache(784.0)     History of migraines  . Hypothyroidism   . Hypercholesteremia   . Pneumonia   . Stomach ulcer   . Complication of anesthesia     slow to wake up    Past Surgical History  Procedure Laterality Date  . Tonsillectomy  age 45  . Tubal ligation  2002  . Dilation and curettage of uterus  1989  . Knee arthroscopy w/ orif Right   . Gland removed from neck    . Hysteroscopy, d&c, endometrial ablation  2006  . Colonoscopy    . Colonoscopy N/A 04/09/2012    Procedure: COLONOSCOPY;  Surgeon: Jamesetta So, MD;  [facility];  Service:  Gastroenterology;  Laterality: N/A;  . Colonoscopy  04-09-12  . Olecranon bursectomy Left 12/13/2013    Procedure: LEFT ELBOW OLECRANON BURSA;  Surgeon: [name], MD;  Location: [facility];  Service: Orthopedics;  Laterality: Left;     Current outpatient prescriptions:  .  bisoprolol-hydrochlorothiazide (ZIAC) 5-6.25 MG per tablet, Take 1 tablet by mouth daily., Disp: , Rfl:  .  levothyroxine (SYNTHROID, LEVOTHROID) 75 MCG tablet, Take 75 mcg by mouth daily before breakfast., Disp: , Rfl:  .  nortriptyline (PAMELOR) 10 MG capsule, TAKE 2 CAPSULES AT BEDTIME., Disp: 60 capsule, Rfl: 11 .  Propylene Glycol (SYSTANE BALANCE) 0.6 % SOLN, Apply 1 drop to eye every morning., Disp: , Rfl:  .  simvastatin (ZOCOR) 20 MG tablet, Take 20 mg by mouth daily. , Disp: , Rfl:  .  SUMAtriptan (IMITREX) 100 MG tablet, Take 1 tablet (100 mg total) by mouth every 2 (two) hours as needed for migraine. May repeat in 2 hours if headache persists or recurs., Disp: 9 tablet, Rfl: 11  Review of Systems Constitutional: negative Gastrointestinal: negative Genitourinary: negative A complete 10 system review of systems was obtained and all systems are negative except as noted in the HPI and PMH.   Objective:  BP 150/86 mmHg  Ht 5' 7.5" (1.715 m)  Wt 187 lb (84.823 kg)  BMI 28.84 kg/m2   BMI: Body mass index  is 28.84 kg/(m^2).  General Appearance: Alert, appropriate appearance for age. No acute distress HEENT: Grossly normal Neck / Thyroid:  Cardiovascular: RRR; normal S1, S2, no murmur Lungs: CTA bilaterally Back: No CVAT Breast Exam: No dimpling, nipple retraction or discharge. No masses or nodes. and No masses or nodes.No dimpling, nipple retraction or discharge. Gastrointestinal: Soft, non-tender, no masses or organomegaly Pelvic Exam: Vulva and vagina appear normal. Bimanual exam reveals normal uterus and adnexa. External genitalia: normal general appearance Urinary system: urethral meatus normal Vaginal:  normal mucosa without prolapse or lesions Cervix: normal appearance Adnexa: normal bimanual exam Uterus: normal single, nontender Rectal: good sphincter tone, no masses, guaiac negative; great rectal support, no rectocele Rectovaginal: not indicated Lymphatic Exam: Non-palpable nodes in neck, clavicular, axillary, or inguinal regions  Skin: no rash or abnormalities Neurologic: Normal gait and speech, no tremor  Psychiatric: Alert and oriented, appropriate affect.  Urinalysis:normal   Mallory Shirk. MD Pgr [phone] 12:42 PM   .jfs I personally performed the services described in this documentation, which was SCRIBED in my presence. The recorded information has been reviewed and considered accurate. It has been edited as necessary during review. Jonnie Kind, MD

## 2014-04-10 NOTE — Progress Notes (Signed)
Patient ID: Maria Price, female   DOB: May 03, 1960, 54 y.o.   MRN: [id number] Pt here today for annual exam. Pt denies any problems or concerns at this time.

## 2014-04-17 ENCOUNTER — Ambulatory Visit
Admission: RE | Admit: 2014-04-17 | Discharge: 2014-04-17 | Disposition: A | Discharge status: Home / Self Care | Payer: No Typology Code available for payment source | Source: Ambulatory Visit

## 2014-04-17 DIAGNOSIS — Z1231 Encounter for screening mammogram for malignant neoplasm of breast: Secondary | ICD-10-CM

## 2014-04-21 ENCOUNTER — Other Ambulatory Visit: Payer: Self-pay | Admitting: Obstetrics and Gynecology

## 2014-04-21 DIAGNOSIS — R928 Other abnormal and inconclusive findings on diagnostic imaging of breast: Secondary | ICD-10-CM

## 2014-04-22 ENCOUNTER — Ambulatory Visit
Admission: RE | Admit: 2014-04-22 | Discharge: 2014-04-22 | Disposition: A | Discharge status: Home / Self Care | Payer: No Typology Code available for payment source | Source: Ambulatory Visit | Attending: Obstetrics and Gynecology | Admitting: Obstetrics and Gynecology

## 2014-04-22 DIAGNOSIS — R928 Other abnormal and inconclusive findings on diagnostic imaging of breast: Secondary | ICD-10-CM

## 2015-01-07 ENCOUNTER — Encounter: Payer: Self-pay | Admitting: Nurse Practitioner

## 2015-01-07 ENCOUNTER — Ambulatory Visit (INDEPENDENT_AMBULATORY_CARE_PROVIDER_SITE_OTHER): Payer: 59 | Admitting: Nurse Practitioner

## 2015-01-07 VITALS — BP 130/80 | HR 76 | Ht 67.0 in | Wt 182.0 lb

## 2015-01-07 DIAGNOSIS — G43009 Migraine without aura, not intractable, without status migrainosus: Secondary | ICD-10-CM

## 2015-01-07 MED ORDER — SUMATRIPTAN SUCCINATE 100 MG PO TABS: 100.0000 mg | ORAL_TABLET | ORAL | Status: DC | PRN

## 2015-01-07 MED ORDER — NORTRIPTYLINE HCL 10 MG PO CAPS: ORAL_CAPSULE | ORAL | Status: DC

## 2015-01-07 NOTE — Patient Instructions (Signed)
Magnesium 200mg  twice daily with food Continue Pamelor will refill Refill Imitrex at current dose will refill F/U yearly

## 2015-01-07 NOTE — Progress Notes (Signed)
[facility]  PATIENT: Maria Price DOB: 28-Apr-1960   REASON FOR VISIT: follow-up for migraine HISTORY FROM:patient    HISTORY OF PRESENT ILLNESS:Maria Price, 55 year old returns for followup. She has a history of migraines for as long as she can remember. Her typical migraine is right retro-orbital severe piercing pain lasting 30 minutes with associated photophobia and phonophobia relieved by lying in a dark quiet room. She denies motor or sensory deficits. Her triggers can be weather changes lack of sleep,  perfumes and windy weather. She currently goes several months without a headache and then they have several in a row. Her last headache was in October but  Lasted five days. She is currently taking nortriptyline as a preventive and Imitrex as needed. She claims she is doing well. She returns for reevaluation and refills   REVIEW OF SYSTEMS: Full 14 system review of systems performed and notable only for those listed, all others are neg:  Constitutional: neg  Cardiovascular: neg Ear/Nose/Throat: neg  Skin: neg Eyes: neg Respiratory: neg Gastroitestinal: neg  Hematology/Lymphatic: neg  Endocrine: neg Musculoskeletal:neg Allergy/Immunology: neg Neurological:  headache Psychiatric: neg Sleep : neg   ALLERGIES: Allergies  Allergen Reactions  . Azithromycin Anaphylaxis  . Levofloxacin Nausea And Vomiting  . Sulfa Drugs Cross Reactors Anaphylaxis    HOME MEDICATIONS: Outpatient Prescriptions Prior to Visit  Medication Sig Dispense Refill  . bisoprolol-hydrochlorothiazide (ZIAC) 5-6.25 MG per tablet Take 1 tablet by mouth daily.    Marland Kitchen levothyroxine (SYNTHROID, LEVOTHROID) 75 MCG tablet Take 75 mcg by mouth daily before breakfast.    . nortriptyline (PAMELOR) 10 MG capsule TAKE 2 CAPSULES AT BEDTIME. 60 capsule 11  . Propylene Glycol (SYSTANE BALANCE) 0.6 % SOLN Apply 1 drop to eye every morning.    . simvastatin (ZOCOR) 20 MG tablet Take 20 mg by mouth  daily.     . SUMAtriptan (IMITREX) 100 MG tablet Take 1 tablet (100 mg total) by mouth every 2 (two) hours as needed for migraine. May repeat in 2 hours if headache persists or recurs. 9 tablet 11   No facility-administered medications prior to visit.    PAST MEDICAL HISTORY: Past Medical History  Diagnosis Date  . Hypertension   . Headache(784.0)     History of migraines  . Hypothyroidism   . Hypercholesteremia   . Pneumonia   . Stomach ulcer   . Complication of anesthesia     slow to wake up    PAST SURGICAL HISTORY: Past Surgical History  Procedure Laterality Date  . Tonsillectomy  age 32  . Tubal ligation  2002  . Dilation and curettage of uterus  1989  . Knee arthroscopy w/ orif Right   . Gland removed from neck    . Hysteroscopy, d&c, endometrial ablation  2006  . Colonoscopy    . Colonoscopy N/A 04/09/2012    Procedure: COLONOSCOPY;  Surgeon: Jamesetta So, MD;  [facility];  Service: Gastroenterology;  Laterality: N/A;  . Colonoscopy  04-09-12  . Olecranon bursectomy Left 12/13/2013    Procedure: LEFT ELBOW OLECRANON BURSA;  Surgeon: [name], MD;  Location: [facility];  Service: Orthopedics;  Laterality: Left;    FAMILY HISTORY: Family History  Problem Relation Age of Onset  . Colon cancer Mother   . COPD Mother   . Congestive Heart Failure Father   . Hypertension Father   . Thyroid disease Father   . Migraines Father   . Diabetes Brother   .  Migraines Paternal Uncle   . Diabetes Paternal Grandmother     SOCIAL HISTORY: Social History   Social History  . Marital Status: Married    Spouse Name: N/A  . Number of Children: N/A  . Years of Education: N/A   Occupational History  . Not on file.   Social History Main Topics  . Smoking status: Never Smoker   . Smokeless tobacco: Never Used  . Alcohol Use: 0.6 oz/week    1 Glasses of wine per week     Comment: occ.  . Drug Use: No  . Sexual Activity: Not Currently    Birth Control/  Protection: Surgical   Other Topics Concern  . Not on file   Social History Narrative     PHYSICAL EXAM  Filed Vitals:   01/07/15 1444  BP: 130/80  Pulse: 76  Height: 5\' 7"  (1.702 m)  Weight: 182 lb (82.555 kg)   Body mass index is 28.5 kg/(m^2). Generalized: Well developed, in no acute distress  Head: normocephalic and atraumatic,. Oropharynx benign  Neck: Supple, no carotid bruits  Musculoskeletal: No deformity   Neurological examination   Mentation: Alert oriented to time, place, history taking. Follows all commands speech and language fluent  Cranial nerve II-XII: Pupils were equal round reactive to light extraocular movements were full, visual field were full on confrontational test. Facial sensation and strength were normal. hearing was intact to finger rubbing bilaterally. Uvula tongue midline. head turning and shoulder shrug and were normal and symmetric.Tongue protrusion into cheek strength was normal. Motor: normal bulk and tone, full strength in the BUE, BLE, fine finger movements normal, No focal weakness Coordination: finger-nose-finger, heel-to-shin bilaterally, no dysmetria Reflexes: Brachioradialis 2/2, biceps 2/2, triceps 2/2, patellar 2/2, Achilles 2/2, plantar responses were flexor bilaterally. Gait and Station: Rising up from seated position without assistance, normal stance, moderate stride, good arm swing, smooth turning, able to perform tiptoe, and heel walking without difficulty. Tandem gait steady    DIAGNOSTIC DATA (LABS, IMAGING, TESTING) -  ASSESSMENT AND PLAN  56 y.o. year old female  has a past medical history of Hypertension; Headache(784.0); /migraine here to follow up. She is doing well on her present regimen  Continue Magnesium 200mg  twice daily with food Continue Pamelor will refill Refill Imitrex at current dose F/U yearly Call for increase in headache frequency Dennie Bible, [facility], [facility], [facility] [address], [place], [place] [address] [phone]

## 2015-01-12 NOTE — Progress Notes (Signed)
I have reviewed and agreed above plan. 

## 2015-02-01 ENCOUNTER — Other Ambulatory Visit: Payer: Self-pay | Admitting: Nurse Practitioner

## 2015-07-27 ENCOUNTER — Other Ambulatory Visit: Payer: Self-pay | Admitting: Obstetrics and Gynecology

## 2015-07-27 DIAGNOSIS — Z1231 Encounter for screening mammogram for malignant neoplasm of breast: Secondary | ICD-10-CM

## 2015-07-30 ENCOUNTER — Ambulatory Visit
Admission: RE | Admit: 2015-07-30 | Discharge: 2015-07-30 | Disposition: A | Discharge status: Home / Self Care | Payer: 59 | Source: Ambulatory Visit | Attending: Obstetrics and Gynecology | Admitting: Obstetrics and Gynecology

## 2015-07-30 DIAGNOSIS — Z1231 Encounter for screening mammogram for malignant neoplasm of breast: Secondary | ICD-10-CM

## 2015-12-02 NOTE — H&P (Signed)
  NTS SOAP Note  Vital Signs:  Vitals as of: 0000000: Systolic [id number]: Diastolic 82: Heart Rate 74: Temp 99.50F (Temporal): Height 58ft 7in: Weight 187Lbs 0 Ounces: BMI 29.29   BMI : 29.29 kg/m2  Subjective: This 55 year old female presents for of follow up for colon polyp.  Patient underwent in February 2014. The polyp was not retrieved due to its small size. Her mother had colon cancer. Patient denies any gi complaints.  Review of Symptoms:  Constitutional:negative Head:negative Eyes:negative sore throat Cardiovascular:negative Respiratory:negative Gastrointestinnegative Genitourinary:negative Musculoskeletal:negative Skin:negative Hematolgic/Lymphatic:negative Allergic/Immunologic:negative   Past Medical History:Reviewed  Past Medical History  Surgical History: TCS with polypectomy 02/2012, BTL Medical Problems: hig cholesterol, hypothyroidism, migraines Allergies: sulfa, erythromycin Medications: Pamolar, HCTZ, synthroid, simvastatin, imetrex   Social History:Reviewed  Social History  Preferred Language: English Race:  White Ethnicity: Not Hispanic / Latino Age: 87 year Marital Status:  S Alcohol: rarely   Smoking Status: Never smoker reviewed on 12/02/2015 Functional Status reviewed on 12/02/2015 ------------------------------------------------ Bathing: Normal Cooking: Normal Dressing: Normal Driving: Normal Eating: Normal Managing Meds: Normal Oral Care: Normal Shopping: Normal Toileting: Normal Transferring: Normal Walking: Normal Cognitive Status reviewed on 12/02/2015 ------------------------------------------------ Attention: Normal Decision Making: Normal Language: Normal Memory: Normal Motor: Normal Perception: Normal Problem Solving: Normal Visual and Spatial: Normal   Family History:Reviewed  Family Health History Mother, Deceased; Colon cancer; Chronic obstructive lung disease (COPD);  Father, Deceased;  Diabetes mellitus, unspecified type;     Objective Information: General:Well appearing, well nourished in no distress. Head:Atraumatic; no masses; no abnormalities Neck:Supple without lymphadenopathy.  Heart:RRR, no murmur or gallop.  Normal S1, S2.  No S3, S4.  Lungs:CTA bilaterally, no wheezes, rhonchi, rales.  Breathing unlabored. Abdomen:Soft, NT/ND, normal bowel sounds, no HSM, no masses.  No peritoneal signs. deferred to procedure Previous TCS report reviewed.  Colon polyp in ascending colon. Assessment:Personal h/o colon polyp, family h/o colon cancer  Diagnoses: V12.72  Z86.010 History of polyp of colon (Personal history of colonic polyps)  Procedures: [phone] - OFFICE OUTPATIENT NEW 20 MINUTES    Plan:  Scheduled for TCS on 12/07/15.  Trilyte prescribed.   Patient Education:Alternative treatments to surgery were discussed with patient (and family).Risks and benefits  of procedure including bleeding, infection, and perforation were fully explained to the patient (and family) who gave informed consent. Patient/family questions were addressed.  Follow-up:Pending Surgery

## 2015-12-07 ENCOUNTER — Encounter (HOSPITAL_COMMUNITY): Payer: Self-pay | Admitting: *Deleted

## 2015-12-07 ENCOUNTER — Ambulatory Visit (HOSPITAL_COMMUNITY)
Admission: RE | Admit: 2015-12-07 | Discharge: 2015-12-07 | Disposition: A | Discharge status: Home / Self Care | Payer: 59 | Source: Ambulatory Visit | Attending: General Surgery | Admitting: General Surgery

## 2015-12-07 ENCOUNTER — Encounter (HOSPITAL_COMMUNITY)
Admission: RE | Disposition: A | Discharge status: Home / Self Care | Payer: Self-pay | Source: Ambulatory Visit | Attending: General Surgery

## 2015-12-07 DIAGNOSIS — E7800 Pure hypercholesterolemia, unspecified: Secondary | ICD-10-CM | POA: Insufficient documentation

## 2015-12-07 DIAGNOSIS — Z800 Family history of malignant neoplasm of digestive organs: Secondary | ICD-10-CM | POA: Insufficient documentation

## 2015-12-07 DIAGNOSIS — E039 Hypothyroidism, unspecified: Secondary | ICD-10-CM | POA: Diagnosis not present

## 2015-12-07 DIAGNOSIS — G43909 Migraine, unspecified, not intractable, without status migrainosus: Secondary | ICD-10-CM | POA: Diagnosis not present

## 2015-12-07 DIAGNOSIS — Z79899 Other long term (current) drug therapy: Secondary | ICD-10-CM | POA: Insufficient documentation

## 2015-12-07 DIAGNOSIS — Z86010 Personal history of colonic polyps: Secondary | ICD-10-CM | POA: Diagnosis not present

## 2015-12-07 DIAGNOSIS — Z1211 Encounter for screening for malignant neoplasm of colon: Secondary | ICD-10-CM | POA: Diagnosis not present

## 2015-12-07 HISTORY — PX: COLONOSCOPY: SHX5424

## 2015-12-07 LAB — GLUCOSE, CAPILLARY: GLUCOSE-CAPILLARY: 87 mg/dL (ref 65–99)

## 2015-12-07 SURGERY — COLONOSCOPY
Anesthesia: Moderate Sedation

## 2015-12-07 MED ORDER — ATROPINE SULFATE 1 MG/ML IJ SOLN
INTRAMUSCULAR | Status: DC | PRN
Start: 2015-12-07 — End: 2015-12-07
  Administered 2015-12-07: .5 mg via INTRAVENOUS

## 2015-12-07 MED ORDER — MEPERIDINE HCL 50 MG/ML IJ SOLN
INTRAMUSCULAR | Status: AC
  Filled 2015-12-07: qty 1

## 2015-12-07 MED ORDER — MIDAZOLAM HCL 5 MG/5ML IJ SOLN
INTRAMUSCULAR | Status: DC | PRN
  Administered 2015-12-07: 1 mg via INTRAVENOUS
  Administered 2015-12-07: 3 mg via INTRAVENOUS

## 2015-12-07 MED ORDER — STERILE WATER FOR IRRIGATION IR SOLN
Status: DC | PRN
  Administered 2015-12-07: 08:00:00

## 2015-12-07 MED ORDER — ATROPINE SULFATE 1 MG/ML IJ SOLN
INTRAMUSCULAR | Status: AC
  Filled 2015-12-07: qty 1

## 2015-12-07 MED ORDER — SODIUM CHLORIDE 0.9 % IV SOLN: INTRAVENOUS | Status: DC

## 2015-12-07 MED ORDER — MIDAZOLAM HCL 5 MG/5ML IJ SOLN
INTRAMUSCULAR | Status: AC
  Filled 2015-12-07: qty 5

## 2015-12-07 MED ORDER — MEPERIDINE HCL 50 MG/ML IJ SOLN
INTRAMUSCULAR | Status: DC | PRN
  Administered 2015-12-07: 50 mg via INTRAVENOUS

## 2015-12-07 NOTE — Interval H&P Note (Signed)
History and Physical Interval Note:  12/07/2015 8:26 AM  Maria Price  has presented today for surgery, with the diagnosis of history of colon polyps, family history colon cancer  The various methods of treatment have been discussed with the patient and family. After consideration of risks, benefits and other options for treatment, the patient has consented to  Procedure(s): COLONOSCOPY (N/A) as a surgical intervention .  The patient's history has been reviewed, patient examined, no change in status, stable for surgery.  I have reviewed the patient's chart and labs.  Questions were answered to the patient's satisfaction.     Aviva Signs A

## 2015-12-07 NOTE — Op Note (Signed)
[facility] Patient Name: Maria Price Procedure Date: 12/07/2015 8:18 AM MRN: [id number] Date of Birth: 1960/01/21 Attending MD: Aviva Signs , MD CSN: [id number] Age: 55 Admit Type: Outpatient Procedure:                Colonoscopy Indications:              Surveillance: Personal history of colonic polyps                            (unknown histology) on last colonoscopy 3 years ago Providers:                Aviva Signs, MD, Otis Peak B. Gwenlyn Perking RN, RN, Isabella Stalling, Technician Referring MD:              Medicines:                Midazolam 4 mg IV, Meperidine 50 mg IV, Atropine                            0.5 mg IV Complications:            No immediate complications. Estimated blood loss:                            None. Estimated Blood Loss:     Estimated blood loss: none. Procedure:                Pre-Anesthesia Assessment:                           - Prior to the procedure, a History and Physical                            was performed, and patient medications and                            allergies were reviewed. The patient is competent.                            The risks and benefits of the procedure and the                            sedation options and risks were discussed with the                            patient. All questions were answered and informed                            consent was obtained. Patient identification and                            proposed procedure were verified by the physician  and the nurse in the procedure room. Mental Status                            Examination: alert and oriented. Airway                            Examination: normal oropharyngeal airway and neck                            mobility. Respiratory Examination: clear to                            auscultation. CV Examination: RRR, no murmurs, no                            S3 or S4. Prophylactic Antibiotics: The  patient                            does not require prophylactic antibiotics. Prior                            Anticoagulants: The patient has taken no previous                            anticoagulant or antiplatelet agents. ASA Grade                            Assessment: II - A patient with mild systemic                            disease. After reviewing the risks and benefits,                            the patient was deemed in satisfactory condition to                            undergo the procedure. The anesthesia plan was to                            use moderate sedation / analgesia (conscious                            sedation). Immediately prior to administration of                            medications, the patient was re-assessed for                            adequacy to receive sedatives. The heart rate,                            respiratory rate, oxygen saturations, blood  pressure, adequacy of pulmonary ventilation, and                            response to care were monitored throughout the                            procedure. The physical status of the patient was                            re-assessed after the procedure.                           After obtaining informed consent, the colonoscope                            was passed under direct vision. Throughout the                            procedure, the patient's blood pressure, pulse, and                            oxygen saturations were monitored continuously. The                            [id number]) scope was introduced through                            the anus and advanced to the the cecum, identified                            by the appendiceal orifice, ileocecal valve and                            palpation. No anatomical landmarks were                            photographed. The entire colon was well visualized.                            The colonoscopy was  performed without difficulty.                            The patient tolerated the procedure well. The                            quality of the bowel preparation was adequate. The                            total duration of the procedure was 12 minutes. Scope In: 8:30:16 AM Scope Out: 8:40:58 AM Scope Withdrawal Time: 0 hours 5 minutes 58 seconds  Total Procedure Duration: 0 hours 10 minutes 42 seconds  Findings:      The entire examined colon appeared normal on direct and retroflexion       views. Impression:               -  The entire examined colon is normal on direct and                            retroflexion views.                           - No specimens collected. Moderate Sedation:      Moderate (conscious) sedation was administered by the endoscopy nurse       and supervised by the endoscopist. The following parameters were       monitored: oxygen saturation, heart rate, blood pressure, and response       to care. Recommendation:           - Written discharge instructions were provided to                            the patient.                           - The signs and symptoms of potential delayed                            complications were discussed with the patient.                           - Patient has a contact number available for                            emergencies.                           - Return to normal activities tomorrow.                           - Resume previous diet.                           - Continue present medications.                           - Repeat colonoscopy in 5 years for screening                            purposes. Procedure Code(s):        --- Professional ---                           [phone], Colonoscopy, flexible; diagnostic, including                            collection of specimen(s) by brushing or washing,                            when performed (separate procedure) Diagnosis Code(s):        --- Professional ---                            Z86.010,  Personal history of colonic polyps CPT copyright 2016 American Medical Association. All rights reserved. The codes documented in this report are preliminary and upon coder review may  be revised to meet current compliance requirements. Aviva Signs, MD Aviva Signs, MD 12/07/2015 8:45:52 AM This report has been signed electronically. Number of Addenda: 0

## 2015-12-07 NOTE — Discharge Instructions (Signed)
Colonoscopy, Adult, Care After  This sheet gives you information about how to care for yourself after your procedure. Your health care provider may also give you more specific instructions. If you have problems or questions, contact your health care provider.  What can I expect after the procedure?  After the procedure, it is common to have:  · A small amount of blood in your stool for 24 hours after the procedure.  · Some gas.  · Mild abdominal cramping or bloating.    Follow these instructions at home:  General instructions     · For the first 24 hours after the procedure:  ? Do not drive or use machinery.  ? Do not sign important documents.  ? Do not drink alcohol.  ? Do your regular daily activities at a slower pace than normal.  ? Eat soft, easy-to-digest foods.  ? Rest often.  · Take over-the-counter or prescription medicines only as told by your health care provider.  · It is up to you to get the results of your procedure. Ask your health care provider, or the department performing the procedure, when your results will be ready.  Relieving cramping and bloating   · Try walking around when you have cramps or feel bloated.  · Apply heat to your abdomen as told by your health care provider. Use a heat source that your health care provider recommends, such as a moist heat pack or a heating pad.  ? Place a towel between your skin and the heat source.  ? Leave the heat on for 20-30 minutes.  ? Remove the heat if your skin turns bright red. This is especially important if you are unable to feel pain, heat, or cold. You may have a greater risk of getting burned.  Eating and drinking   · Drink enough fluid to keep your urine clear or pale yellow.  · Resume your normal diet as instructed by your health care provider. Avoid heavy or fried foods that are hard to digest.  · Avoid drinking alcohol for as long as instructed by your health care provider.  Contact a health care provider if:  · You have blood in your stool 2-3  days after the procedure.  Get help right away if:  · You have more than a small spotting of blood in your stool.  · You pass large blood clots in your stool.  · Your abdomen is swollen.  · You have nausea or vomiting.  · You have a fever.  · You have increasing abdominal pain that is not relieved with medicine.  This information is not intended to replace advice given to you by your health care provider. Make sure you discuss any questions you have with your health care provider.  Document Released: 08/03/2003 Document Revised: 09/13/2015 Document Reviewed: 03/02/2015  Elsevier Interactive Patient Education © 2017 [employer]vier Inc.

## 2015-12-13 ENCOUNTER — Encounter (HOSPITAL_COMMUNITY): Payer: Self-pay | Admitting: General Surgery

## 2015-12-30 ENCOUNTER — Telehealth: Payer: Self-pay | Admitting: *Deleted

## 2015-12-30 ENCOUNTER — Other Ambulatory Visit: Payer: Self-pay | Admitting: Nurse Practitioner

## 2015-12-30 NOTE — Telephone Encounter (Signed)
Pt called back and rescheduled

## 2015-12-30 NOTE — Telephone Encounter (Signed)
LMVM mobile. That need to reschedule appt from 4:30pm  at 01-11-16 to another time.  (once this is done to block that time),

## 2016-01-11 ENCOUNTER — Ambulatory Visit: Payer: 59 | Admitting: Nurse Practitioner

## 2016-01-12 ENCOUNTER — Ambulatory Visit (INDEPENDENT_AMBULATORY_CARE_PROVIDER_SITE_OTHER): Payer: 59 | Admitting: Nurse Practitioner

## 2016-01-12 ENCOUNTER — Encounter: Payer: Self-pay | Admitting: Nurse Practitioner

## 2016-01-12 VITALS — BP 126/84 | HR 72 | Ht 67.0 in | Wt 184.6 lb

## 2016-01-12 DIAGNOSIS — G43009 Migraine without aura, not intractable, without status migrainosus: Secondary | ICD-10-CM | POA: Diagnosis not present

## 2016-01-12 MED ORDER — NORTRIPTYLINE HCL 10 MG PO CAPS: 20.0000 mg | ORAL_CAPSULE | Freq: Every day | ORAL | 11 refills | Status: DC

## 2016-01-12 NOTE — Patient Instructions (Signed)
Continue Pamelor will refill Refill Imitrex at current dose F/U yearly

## 2016-01-12 NOTE — Progress Notes (Signed)
[facility]  PATIENT: Maria Price DOB: 1960-01-23   REASON FOR VISIT: follow-up for migraine HISTORY FROM:patient    HISTORY OF PRESENT ILLNESS:Maria Price, 56 year old returns for yearly followup. She has a history of migraines for as long as she can remember. Her typical migraine is right retro-orbital severe piercing pain lasting 30 minutes with associated photophobia and phonophobia relieved by lying in a dark quiet room. She denies motor or sensory deficits. Her triggers can be weather changes lack of sleep,  perfumes and windy weather. She currently goes  months without a headache and then they have several in a row. Her last headache was in June 2017. She is currently taking nortriptyline as a preventive and Imitrex as needed. She claims she is doing well. She returns for reevaluation and refills   REVIEW OF SYSTEMS: Full 14 system review of systems performed and notable only for those listed, all others are neg:  Constitutional: neg  Cardiovascular: neg Ear/Nose/Throat: neg  Skin: neg Eyes: neg Respiratory: neg Gastroitestinal: neg  Hematology/Lymphatic: neg  Endocrine: neg Musculoskeletal:neg Allergy/Immunology: neg Neurological:  headache Psychiatric: neg Sleep : neg   ALLERGIES: Allergies  Allergen Reactions  . Azithromycin Anaphylaxis  . Levofloxacin Nausea And Vomiting  . Sulfa Drugs Cross Reactors Anaphylaxis    HOME MEDICATIONS: Outpatient Medications Prior to Visit  Medication Sig Dispense Refill  . Cholecalciferol (VITAMIN D3) 50000 units CAPS Take 50,000 Units by mouth every Sunday.    . levothyroxine (SYNTHROID, LEVOTHROID) 75 MCG tablet Take 75 mcg by mouth daily before breakfast.     . naproxen sodium (ANAPROX) 220 MG tablet Take 220 mg by mouth 2 (two) times daily as needed (for pain).    . nortriptyline (PAMELOR) 10 MG capsule TAKE 2 CAPSULES AT BEDTIME. 60 capsule 11  . simvastatin (ZOCOR) 20 MG tablet Take 20 mg by mouth at  bedtime.    . SUMAtriptan (IMITREX) 100 MG tablet Take 1 tablet (100 mg total) by mouth every 2 (two) hours as needed for migraine. May repeat in 2 hours if headache persists or recurs. 9 tablet 11  . ZIAC 5-6.25 MG tablet Take 1 tablet by mouth daily.    . AUGMENTIN 875-125 MG tablet Take 1 tablet by mouth 2 (two) times daily. 10 day therapy course patient began on 11/28/15    . benzonatate (TESSALON) 100 MG capsule Take 100 mg by mouth 3 (three) times daily.     No facility-administered medications prior to visit.     PAST MEDICAL HISTORY: Past Medical History:  Diagnosis Date  . Complication of anesthesia    slow to wake up  . Headache(784.0)    History of migraines  . Hypercholesteremia   . Hypertension   . Hypothyroidism   . Pneumonia   . Stomach ulcer     PAST SURGICAL HISTORY: Past Surgical History:  Procedure Laterality Date  . COLONOSCOPY    . COLONOSCOPY N/A 04/09/2012   Procedure: COLONOSCOPY;  Surgeon: Jamesetta So, MD;  Location: [facility];  Service: Gastroenterology;  Laterality: N/A;  . COLONOSCOPY  04-09-12  . COLONOSCOPY N/A 12/07/2015   Procedure: COLONOSCOPY;  Surgeon: Aviva Signs, MD;  Location: [facility];  Service: Gastroenterology;  Laterality: N/A;  . DILATION AND CURETTAGE OF UTERUS  1989  . Gland removed from neck    . Hysteroscopy, D&C, Endometrial Ablation  2006  . KNEE ARTHROSCOPY W/ ORIF Right   . OLECRANON BURSECTOMY Left 12/13/2013   Procedure: LEFT ELBOW  OLECRANON BURSA;  Surgeon: [name], MD;  Location: [place];  Service: Orthopedics;  Laterality: Left;  . TONSILLECTOMY  age 9  . TUBAL LIGATION  2002    FAMILY HISTORY: Family History  Problem Relation Age of Onset  . Colon cancer Mother   . COPD Mother   . Congestive Heart Failure Father   . Hypertension Father   . Thyroid disease Father   . Migraines Father   . Diabetes Brother   . Migraines Paternal Uncle   . Diabetes Paternal Grandmother     SOCIAL HISTORY: Social  History   Social History  . Marital status: Married    Spouse name: N/A  . Number of children: N/A  . Years of education: N/A   Occupational History  . Not on file.   Social History Main Topics  . Smoking status: Never Smoker  . Smokeless tobacco: Never Used  . Alcohol use 0.6 oz/week    1 Glasses of wine per week     Comment: occ.  . Drug use: No  . Sexual activity: Not Currently    Birth control/ protection: Surgical   Other Topics Concern  . Not on file   Social History Narrative  . No narrative on file     PHYSICAL EXAM  Vitals:   01/12/16 1358  BP: 126/84  Pulse: 72  Weight: 184 lb 9.6 oz (83.7 kg)  Height: 5\' 7"  (1.702 m)   Body mass index is 28.91 kg/m. Generalized: Well developed, in no acute distress  Head: normocephalic and atraumatic,. Oropharynx benign  Neck: Supple, no carotid bruits  Musculoskeletal: No deformity   Neurological examination   Mentation: Alert oriented to time, place, history taking. Follows all commands speech and language fluent  Cranial nerve II-XII: Pupils were equal round reactive to light extraocular movements were full, visual field were full on confrontational test. Facial sensation and strength were normal. hearing was intact to finger rubbing bilaterally. Uvula tongue midline. head turning and shoulder shrug and were normal and symmetric.Tongue protrusion into cheek strength was normal. Motor: normal bulk and tone, full strength in the BUE, BLE, fine finger movements normal, No focal weakness Coordination: finger-nose-finger, heel-to-shin bilaterally, no dysmetria Reflexes: Brachioradialis 2/2, biceps 2/2, triceps 2/2, patellar 2/2, Achilles 2/2, plantar responses were flexor bilaterally. Gait and Station: Rising up from seated position without assistance, normal stance, moderate stride, good arm swing, smooth turning, able to perform tiptoe, and heel walking without difficulty. Tandem gait steady    DIAGNOSTIC DATA  (LABS, IMAGING, TESTING) -  ASSESSMENT AND PLAN  56 y.o. year old female  has a past medical history of Hypertension; Headache(784.0); /migraine here to follow up. She is doing well on her present regimen  Continue Pamelor will refill Refill Imitrex at current dose F/U yearly Call for increase in headache frequency Dennie Bible, [facility], [facility], [facility] [address], [place], [place] [address] [phone]

## 2016-01-14 NOTE — Progress Notes (Signed)
I have reviewed and agreed above plan. 

## 2016-10-06 ENCOUNTER — Other Ambulatory Visit: Payer: Self-pay

## 2016-10-06 ENCOUNTER — Other Ambulatory Visit: Payer: Self-pay | Admitting: *Deleted

## 2016-10-06 ENCOUNTER — Other Ambulatory Visit: Payer: Self-pay | Admitting: Obstetrics and Gynecology

## 2016-10-06 DIAGNOSIS — Z1231 Encounter for screening mammogram for malignant neoplasm of breast: Secondary | ICD-10-CM

## 2016-10-20 ENCOUNTER — Ambulatory Visit
Admission: RE | Admit: 2016-10-20 | Discharge: 2016-10-20 | Disposition: A | Discharge status: Home / Self Care | Payer: 59 | Source: Ambulatory Visit | Attending: Obstetrics and Gynecology | Admitting: Obstetrics and Gynecology

## 2016-10-20 DIAGNOSIS — Z1231 Encounter for screening mammogram for malignant neoplasm of breast: Secondary | ICD-10-CM

## 2017-01-10 NOTE — Progress Notes (Signed)
[facility]  PATIENT: Maria Price DOB: 1960/03/16   REASON FOR VISIT: follow-up for migraine HISTORY FROM:patient    HISTORY OF PRESENT ILLNESS:Maria Price, 57 year old returns for yearly followup. She has a history of migraines for as long as she can remember. Her typical migraine is right retro-orbital severe piercing pain lasting 30 minutes with associated photophobia and phonophobia relieved by lying in a dark quiet room. She denies motor or sensory deficits. Her triggers can be weather changes lack of sleep,  perfumes and windy weather.  She says menopause has ended and her migraines are decreased.  Discussed decreasing her dose of nortriptyline, she is currently taking nortriptyline as a preventive and Imitrex as needed. She claims she is doing well. She returns for reevaluation and refills   REVIEW OF SYSTEMS: Full 14 system review of systems performed and notable only for those listed, all others are neg:  Constitutional: neg  Cardiovascular: neg Ear/Nose/Throat: neg  Skin: neg Eyes: neg Respiratory: neg Gastroitestinal: neg  Hematology/Lymphatic: neg  Endocrine: neg Musculoskeletal:neg Allergy/Immunology: neg Neurological:  headache Psychiatric: neg Sleep : neg   ALLERGIES: Allergies  Allergen Reactions  . Azithromycin Anaphylaxis  . Levofloxacin Nausea And Vomiting  . Sulfa Drugs Cross Reactors Anaphylaxis    HOME MEDICATIONS: Outpatient Medications Prior to Visit  Medication Sig Dispense Refill  . levothyroxine (SYNTHROID, LEVOTHROID) 75 MCG tablet Take 75 mcg by mouth daily before breakfast.     . Magnesium 100 MG CAPS Take 100 mg by mouth daily.    . Multiple Vitamins-Minerals (VITAMIN D3 COMPLETE PO) Take 4,000 Units by mouth daily.    . nortriptyline (PAMELOR) 10 MG capsule Take 2 capsules (20 mg total) by mouth at bedtime. 60 capsule 11  . simvastatin (ZOCOR) 20 MG tablet Take 20 mg by mouth at bedtime.    . SUMAtriptan (IMITREX)  100 MG tablet Take 1 tablet (100 mg total) by mouth every 2 (two) hours as needed for migraine. May repeat in 2 hours if headache persists or recurs. 9 tablet 11  . UNABLE TO FIND Med Name:tumeric + fish oil    . ZIAC 5-6.25 MG tablet Take 1 tablet by mouth daily.    . Cholecalciferol (VITAMIN D3) 50000 units CAPS Take 50,000 Units by mouth every Sunday.    . naproxen sodium (ANAPROX) 220 MG tablet Take 220 mg by mouth 2 (two) times daily as needed (for pain).     No facility-administered medications prior to visit.     PAST MEDICAL HISTORY: Past Medical History:  Diagnosis Date  . Complication of anesthesia    slow to wake up  . Headache(784.0)    History of migraines  . Hypercholesteremia   . Hypertension   . Hypothyroidism   . Pneumonia   . Stomach ulcer     PAST SURGICAL HISTORY: Past Surgical History:  Procedure Laterality Date  . BREAST BIOPSY    . COLONOSCOPY    . COLONOSCOPY N/A 04/09/2012   Procedure: COLONOSCOPY;  Surgeon: Jamesetta So, MD;  [facility];  Service: Gastroenterology;  Laterality: N/A;  . COLONOSCOPY  04-09-12  . COLONOSCOPY N/A 12/07/2015   Procedure: COLONOSCOPY;  Surgeon: Aviva Signs, MD;  [facility];  Service: Gastroenterology;  Laterality: N/A;  . DILATION AND CURETTAGE OF UTERUS  1989  . Gland removed from neck    . Hysteroscopy, D&C, Endometrial Ablation  2006  . KNEE ARTHROSCOPY W/ ORIF Right   . OLECRANON BURSECTOMY Left 12/13/2013  Procedure: LEFT ELBOW OLECRANON BURSA;  Surgeon: [name], MD;  Location: [place];  Service: Orthopedics;  Laterality: Left;  . TONSILLECTOMY  age 60  . TUBAL LIGATION  2002    FAMILY HISTORY: Family History  Problem Relation Age of Onset  . Colon cancer Mother   . COPD Mother   . Congestive Heart Failure Father   . Hypertension Father   . Thyroid disease Father   . Migraines Father   . Diabetes Brother   . Migraines Paternal Uncle   . Diabetes Paternal Grandmother   .  Breast cancer Neg Hx     SOCIAL HISTORY: Social History   Socioeconomic History  . Marital status: Married    Spouse name: Not on file  . Number of children: Not on file  . Years of education: Not on file  . Highest education level: Not on file  Social Needs  . Financial resource strain: Not on file  . Food insecurity - worry: Not on file  . Food insecurity - inability: Not on file  . Transportation needs - medical: Not on file  . Transportation needs - non-medical: Not on file  Occupational History  . Not on file  Tobacco Use  . Smoking status: Never Smoker  . Smokeless tobacco: Never Used  Substance and Sexual Activity  . Alcohol use: Yes    Alcohol/week: 0.6 oz    Types: 1 Glasses of wine per week    Comment: occ.  . Drug use: No  . Sexual activity: Not Currently    Birth control/protection: Surgical  Other Topics Concern  . Not on file  Social History Narrative  . Not on file     PHYSICAL EXAM  Vitals:   01/11/17 1528  BP: 114/77  Pulse: 71  Weight: 187 lb 3.2 oz (84.9 kg)   Body mass index is 29.32 kg/m. Generalized: Well developed, in no acute distress  Head: normocephalic and atraumatic,. Oropharynx benign  Neck: Supple, no carotid bruits  Musculoskeletal: No deformity   Neurological examination   Mentation: Alert oriented to time, place, history taking. Follows all commands speech and language fluent  Cranial nerve II-XII: Pupils were equal round reactive to light extraocular movements were full, visual field were full on confrontational test. Facial sensation and strength were normal. hearing was intact to finger rubbing bilaterally. Uvula tongue midline. head turning and shoulder shrug and were normal and symmetric.Tongue protrusion into cheek strength was normal. Motor: normal bulk and tone, full strength in the BUE, BLE, fine finger movements normal, No focal weakness Coordination: finger-nose-finger, heel-to-shin bilaterally, no  dysmetria Reflexes: Brachioradialis 2/2, biceps 2/2, triceps 2/2, patellar 2/2, Achilles 2/2, plantar responses were flexor bilaterally. Gait and Station: Rising up from seated position without assistance, normal stance, moderate stride, good arm swing, smooth turning, able to perform tiptoe, and heel walking without difficulty. Tandem gait steady    DIAGNOSTIC DATA (LABS, IMAGING, TESTING) -  ASSESSMENT AND PLAN  57 y.o. year old female  has a past medical history of Hypertension; Headache(784.0); /migraine here to follow up. She is doing well on her present regimen, and having much less headaches.  PLAN: Decrease  Pamelor to 10mg  daily will refill if headaches return can go back up to 20 mg Refill Imitrex at current dose F/U yearly Call for increase in headache frequency Given list of migraine triggers common foods that are migraine triggers Dennie Bible, [facility], [facility], APRN  [facility] [address] [place],  [place] [address] [phone]

## 2017-01-11 ENCOUNTER — Encounter: Payer: Self-pay | Admitting: Nurse Practitioner

## 2017-01-11 ENCOUNTER — Ambulatory Visit: Payer: 59 | Admitting: Nurse Practitioner

## 2017-01-11 VITALS — BP 114/77 | HR 71 | Wt 187.2 lb

## 2017-01-11 DIAGNOSIS — G43009 Migraine without aura, not intractable, without status migrainosus: Secondary | ICD-10-CM | POA: Diagnosis not present

## 2017-01-11 MED ORDER — SUMATRIPTAN SUCCINATE 100 MG PO TABS: 100.0000 mg | ORAL_TABLET | ORAL | 11 refills | Status: DC | PRN

## 2017-01-11 MED ORDER — NORTRIPTYLINE HCL 10 MG PO CAPS: 10.0000 mg | ORAL_CAPSULE | Freq: Every day | ORAL | 11 refills | Status: DC

## 2017-01-11 NOTE — Progress Notes (Signed)
I have reviewed and agreed above plan. 

## 2017-01-11 NOTE — Patient Instructions (Signed)
Decrease  Pamelor to 10mg  daily will refill Refill Imitrex at current dose F/U yearly Call for increase in headache frequency Given list of migraine triggers

## 2017-01-12 NOTE — Progress Notes (Signed)
I have reviewed and agreed above plan. 

## 2017-10-16 ENCOUNTER — Other Ambulatory Visit: Payer: Self-pay | Admitting: Family Medicine

## 2017-10-16 DIAGNOSIS — Z1231 Encounter for screening mammogram for malignant neoplasm of breast: Secondary | ICD-10-CM

## 2017-11-20 ENCOUNTER — Ambulatory Visit
Admission: RE | Admit: 2017-11-20 | Discharge: 2017-11-20 | Disposition: A | Discharge status: Home / Self Care | Payer: 59 | Source: Ambulatory Visit | Attending: Family Medicine | Admitting: Family Medicine

## 2017-11-20 DIAGNOSIS — Z1231 Encounter for screening mammogram for malignant neoplasm of breast: Secondary | ICD-10-CM

## 2018-01-15 NOTE — Progress Notes (Signed)
[facility]  PATIENT: Maria Price DOB: 1960/03/14   REASON FOR VISIT: follow-up for migraine HISTORY FROM:patient    HISTORY OF PRESENT ILLNESS:UPDATE 1/15/2020CM Maria Price, 58 year old female returns for follow-up with history of migraines for years.  She has had less headaches the last couple of years her Pamelor is down to 10 mg daily.  She does not think she is used her Imitrex in the last year. When she does have a headache they are not nearly as intense as they have been in the past. Wine, chocolate and weather changes are her migraine triggers.  No interval medical issues.  She returns for reevaluation  1/10/19CM Maria Price, 58 year old returns for yearly followup. She has a history of migraines for as long as she can remember. Her typical migraine is right retro-orbital severe piercing pain lasting 30 minutes with associated photophobia and phonophobia relieved by lying in a dark quiet room. She denies motor or sensory deficits. Her triggers can be weather changes lack of sleep,  perfumes and windy weather.  She says menopause has ended and her migraines are decreased.  Discussed decreasing her dose of nortriptyline, she is currently taking nortriptyline as a preventive and Imitrex as needed. She claims she is doing well. She returns for reevaluation and refills   REVIEW OF SYSTEMS: Full 14 system review of systems performed and notable only for those listed, all others are neg:  Constitutional: neg  Cardiovascular: neg Ear/Nose/Throat: neg  Skin: neg Eyes: neg Respiratory: neg Gastroitestinal: neg  Hematology/Lymphatic: neg  Endocrine: neg Musculoskeletal:neg Allergy/Immunology: neg Neurological:  headache Psychiatric: neg Sleep : neg   ALLERGIES: Allergies  Allergen Reactions  . Azithromycin Anaphylaxis  . Levofloxacin Nausea And Vomiting  . Sulfa Drugs Cross Reactors Anaphylaxis    HOME MEDICATIONS: Outpatient Medications Prior to Visit    Medication Sig Dispense Refill  . Cholecalciferol (VITAMIN D3) 50 MCG (2000 UT) capsule Take 2,000 Units by mouth daily.    Marland Kitchen CINNAMON PO Take 1,000 mg by mouth daily.    Marland Kitchen levothyroxine (SYNTHROID, LEVOTHROID) 100 MCG tablet Take 100 mcg by mouth daily before breakfast.    . Magnesium 100 MG CAPS Take 100 mg by mouth daily.    . Multiple Vitamins-Minerals (VITAMIN D3 COMPLETE PO) Take 4,000 Units by mouth daily.    . naproxen sodium (ANAPROX) 220 MG tablet Take 220 mg by mouth 2 (two) times daily as needed (for pain).    . nortriptyline (PAMELOR) 10 MG capsule Take 1 capsule (10 mg total) by mouth at bedtime. 30 capsule 11  . simvastatin (ZOCOR) 20 MG tablet Take 20 mg by mouth at bedtime.    . SUMAtriptan (IMITREX) 100 MG tablet Take 1 tablet (100 mg total) by mouth every 2 (two) hours as needed for migraine. May repeat in 2 hours if headache persists or recurs. 9 tablet 11  . UNABLE TO FIND Med Name:tumeric + fish oil    . ZIAC 5-6.25 MG tablet Take 1 tablet by mouth daily.    Marland Kitchen levothyroxine (SYNTHROID, LEVOTHROID) 75 MCG tablet Take 75 mcg by mouth daily before breakfast.      No facility-administered medications prior to visit.     PAST MEDICAL HISTORY: Past Medical History:  Diagnosis Date  . Complication of anesthesia    slow to wake up  . Headache(784.0)    History of migraines  . Hypercholesteremia   . Hypertension   . Hypothyroidism   . Pneumonia   . Stomach ulcer  PAST SURGICAL HISTORY: Past Surgical History:  Procedure Laterality Date  . BREAST BIOPSY    . COLONOSCOPY    . COLONOSCOPY N/A 04/09/2012   Procedure: COLONOSCOPY;  Surgeon: Jamesetta So, MD;  [facility];  Service: Gastroenterology;  Laterality: N/A;  . COLONOSCOPY  04-09-12  . COLONOSCOPY N/A 12/07/2015   Procedure: COLONOSCOPY;  Surgeon: Aviva Signs, MD;  [facility];  Service: Gastroenterology;  Laterality: N/A;  . DILATION AND CURETTAGE OF UTERUS  1989  . Gland removed  from neck    . Hysteroscopy, D&C, Endometrial Ablation  2006  . KNEE ARTHROSCOPY W/ ORIF Right   . OLECRANON BURSECTOMY Left 12/13/2013   Procedure: LEFT ELBOW OLECRANON BURSA;  Surgeon: [name], MD;  Location: [place];  Service: Orthopedics;  Laterality: Left;  . TONSILLECTOMY  age 70  . TUBAL LIGATION  2002    FAMILY HISTORY: Family History  Problem Relation Age of Onset  . Colon cancer Mother   . COPD Mother   . Congestive Heart Failure Father   . Hypertension Father   . Thyroid disease Father   . Migraines Father   . Diabetes Brother   . Migraines Paternal Uncle   . Diabetes Paternal Grandmother   . Breast cancer Neg Hx     SOCIAL HISTORY: Social History   Socioeconomic History  . Marital status: Married    Spouse name: Not on file  . Number of children: Not on file  . Years of education: Not on file  . Highest education level: Not on file  Occupational History  . Not on file  Social Needs  . Financial resource strain: Not on file  . Food insecurity:    Worry: Not on file    Inability: Not on file  . Transportation needs:    Medical: Not on file    Non-medical: Not on file  Tobacco Use  . Smoking status: Never Smoker  . Smokeless tobacco: Never Used  Substance and Sexual Activity  . Alcohol use: Yes    Alcohol/week: 1.0 standard drinks    Types: 1 Glasses of wine per week    Comment: occ.  . Drug use: No  . Sexual activity: Not Currently    Birth control/protection: Surgical  Lifestyle  . Physical activity:    Days per week: Not on file    Minutes per session: Not on file  . Stress: Not on file  Relationships  . Social connections:    Talks on phone: Not on file    Gets together: Not on file    Attends religious service: Not on file    Active member of club or organization: Not on file    Attends meetings of clubs or organizations: Not on file    Relationship status: Not on file  . Intimate partner violence:    Fear of current or ex partner:  Not on file    Emotionally abused: Not on file    Physically abused: Not on file    Forced sexual activity: Not on file  Other Topics Concern  . Not on file  Social History Narrative  . Not on file     PHYSICAL EXAM  Vitals:   01/16/18 1533  BP: 129/80  Pulse: 65  Weight: 183 lb 6.4 oz (83.2 kg)  Height: 5\' 7"  (1.702 m)   Body mass index is 28.72 kg/m. Generalized: Well developed, in no acute distress  Head: normocephalic and atraumatic,. Oropharynx benign  Neck: Supple,  Musculoskeletal: No deformity   Neurological examination   Mentation: Alert oriented to time, place, history taking. Follows all commands speech and language fluent  Cranial nerve II-XII: Pupils were equal round reactive to light extraocular movements were full, visual field were full on confrontational test. Facial sensation and strength were normal. hearing was intact to finger rubbing bilaterally. Uvula tongue midline. head turning and shoulder shrug and were normal and symmetric.Tongue protrusion into cheek strength was normal. Motor: normal bulk and tone, full strength in the BUE, BLE, fine finger movements normal, No focal weakness Coordination: finger-nose-finger, heel-to-shin bilaterally, no dysmetria Reflexes: Symmetric upper and lower  plantar responses were flexor bilaterally. Gait and Station: Rising up from seated position without assistance, normal stance, moderate stride, good arm swing, smooth turning, able to perform tiptoe, and heel walking without difficulty. Tandem gait steady    DIAGNOSTIC DATA (LABS, IMAGING, TESTING) -  ASSESSMENT AND PLAN  58 y.o. year old female  has a past medical history of Hypertension; Headache(784.0); /migraine here to follow up. She is doing well on her present regimen, and having much less headaches.  PLAN: Continue  Pamelor to 10mg  daily will refill  Refill Imitrex at current dose F/U yearly Call for increase in headache frequency Avoid  migraine  triggers  Dennie Bible, [facility], [facility], [facility] [address] [place], [place] [address] [phone]

## 2018-01-16 ENCOUNTER — Ambulatory Visit (INDEPENDENT_AMBULATORY_CARE_PROVIDER_SITE_OTHER): Payer: PRIVATE HEALTH INSURANCE | Admitting: Nurse Practitioner

## 2018-01-16 ENCOUNTER — Encounter: Payer: Self-pay | Admitting: Nurse Practitioner

## 2018-01-16 VITALS — BP 129/80 | HR 65 | Ht 67.0 in | Wt 183.4 lb

## 2018-01-16 DIAGNOSIS — G43009 Migraine without aura, not intractable, without status migrainosus: Secondary | ICD-10-CM

## 2018-01-16 MED ORDER — SUMATRIPTAN SUCCINATE 100 MG PO TABS: 100.0000 mg | ORAL_TABLET | ORAL | 0 refills | Status: DC | PRN

## 2018-01-16 MED ORDER — NORTRIPTYLINE HCL 10 MG PO CAPS
10.0000 mg | ORAL_CAPSULE | Freq: Every day | ORAL | 3 refills | Status: DC
Start: 2018-01-16 — End: 2023-03-15

## 2018-01-16 NOTE — Patient Instructions (Signed)
Continue  Pamelor to 10mg  daily will refill  Refill Imitrex at current dose F/U yearly Call for increase in headache frequency Avoid  migraine triggers

## 2018-01-17 NOTE — Progress Notes (Signed)
I have reviewed and agreed above plan. 

## 2018-10-31 ENCOUNTER — Other Ambulatory Visit: Payer: Self-pay

## 2018-10-31 ENCOUNTER — Encounter: Payer: Self-pay | Admitting: General Surgery

## 2018-10-31 ENCOUNTER — Ambulatory Visit (INDEPENDENT_AMBULATORY_CARE_PROVIDER_SITE_OTHER): Payer: Self-pay | Admitting: General Surgery

## 2018-10-31 VITALS — BP 130/86 | HR 63 | Temp 97.4°F | Resp 16 | Ht 67.0 in | Wt 181.0 lb

## 2018-10-31 DIAGNOSIS — Z86010 Personal history of colonic polyps: Secondary | ICD-10-CM

## 2018-10-31 MED ORDER — GOLYTELY 236 G PO SOLR: 4000.0000 mL | Freq: Once | ORAL | 0 refills | Status: DC

## 2018-10-31 NOTE — H&P (Signed)
Maria Price; [id number]; January 11, 1960   HPI Patient is a 58 year old white female who was referred to my care by Dr. Hilma Favors for a surveillance colonoscopy.  Patient had a colonoscopy in 2017 which did not show any polyps.  Previous colonoscopy in 2014 did show polyps.  She has a strong family history of colon cancer in her grandmother and mother.  She denies any abdominal pain, abnormal diarrhea or constipation, weight loss, or blood per rectum.  She currently has 0 out of 10 abdominal pain.  Her bowel movements have been normal. Past Medical History:  Diagnosis Date  . Complication of anesthesia    slow to wake up  . Headache(784.0)    History of migraines  . Hypercholesteremia   . Hypertension   . Hypothyroidism   . Pneumonia   . Stomach ulcer     Past Surgical History:  Procedure Laterality Date  . BREAST BIOPSY    . COLONOSCOPY    . COLONOSCOPY N/A 04/09/2012   Procedure: COLONOSCOPY;  Surgeon: Jamesetta So, MD;  [facility];  Service: Gastroenterology;  Laterality: N/A;  . COLONOSCOPY  04-09-12  . COLONOSCOPY N/A 12/07/2015   Procedure: COLONOSCOPY;  Surgeon: Aviva Signs, MD;  [facility];  Service: Gastroenterology;  Laterality: N/A;  . DILATION AND CURETTAGE OF UTERUS  1989  . Gland removed from neck    . Hysteroscopy, D&C, Endometrial Ablation  2006  . KNEE ARTHROSCOPY W/ ORIF Right   . OLECRANON BURSECTOMY Left 12/13/2013   Procedure: LEFT ELBOW OLECRANON BURSA;  Surgeon: [name], MD;  Location: [place];  Service: Orthopedics;  Laterality: Left;  . TONSILLECTOMY  age 26  . TUBAL LIGATION  2002    Family History  Problem Relation Age of Onset  . Colon cancer Mother   . COPD Mother   . Congestive Heart Failure Father   . Hypertension Father   . Thyroid disease Father   . Migraines Father   . Diabetes Brother   . Migraines Paternal Uncle   . Diabetes Paternal Grandmother   . Breast cancer Neg Hx     Current Outpatient Medications on  File Prior to Visit  Medication Sig Dispense Refill  . Cholecalciferol (VITAMIN D3) 50 MCG (2000 UT) capsule Take 2,000 Units by mouth daily.    Marland Kitchen CINNAMON PO Take 1,000 mg by mouth daily.    Marland Kitchen levothyroxine (SYNTHROID, LEVOTHROID) 100 MCG tablet Take 100 mcg by mouth daily before breakfast.    . Magnesium 100 MG CAPS Take 100 mg by mouth daily.    . Multiple Vitamins-Minerals (VITAMIN D3 COMPLETE PO) Take 4,000 Units by mouth daily.    . naproxen sodium (ANAPROX) 220 MG tablet Take 220 mg by mouth 2 (two) times daily as needed (for pain).    . nortriptyline (PAMELOR) 10 MG capsule Take 1 capsule (10 mg total) by mouth at bedtime. 90 capsule 3  . simvastatin (ZOCOR) 20 MG tablet Take 20 mg by mouth at bedtime.    . SUMAtriptan (IMITREX) 100 MG tablet Take 1 tablet (100 mg total) by mouth every 2 (two) hours as needed for migraine. May repeat in 2 hours if headache persists or recurs. 9 tablet 0  . UNABLE TO FIND Med Name:tumeric + fish oil    . ZIAC 5-6.25 MG tablet Take 1 tablet by mouth daily.     No current facility-administered medications on file prior to visit.     Allergies  Allergen Reactions  .  Azithromycin Anaphylaxis  . Levofloxacin Nausea And Vomiting  . Sulfa Drugs Cross Reactors Anaphylaxis    Social History   Substance and Sexual Activity  Alcohol Use Yes  . Alcohol/week: 1.0 standard drinks  . Types: 1 Glasses of wine per week   Comment: occ.    Social History   Tobacco Use  Smoking Status Never Smoker  Smokeless Tobacco Never Used    Review of Systems  Constitutional: Negative.   HENT: Negative.   Eyes: Negative.   Respiratory: Negative.   Cardiovascular: Negative.   Gastrointestinal: Negative.   Genitourinary: Negative.   Musculoskeletal: Negative.   Skin: Negative.   Neurological: Negative.   Endo/Heme/Allergies: Negative.   Psychiatric/Behavioral: Negative.     Objective   Vitals:   10/31/18 0907  BP: 130/86  Pulse: 63  Resp: 16  Temp:  (!) 97.4 F (36.3 C)  SpO2: 96%    Physical Exam Vitals signs reviewed.  Constitutional:      Appearance: Normal appearance. She is normal weight. She is not ill-appearing.  HENT:     Head: Normocephalic and atraumatic.  Cardiovascular:     Rate and Rhythm: Normal rate and regular rhythm.     Heart sounds: Normal heart sounds. No murmur. No friction rub. No gallop.   Pulmonary:     Effort: Pulmonary effort is normal. No respiratory distress.     Breath sounds: Normal breath sounds. No stridor. No wheezing, rhonchi or rales.  Abdominal:     General: Abdomen is flat. Bowel sounds are normal. There is no distension.     Palpations: Abdomen is soft. There is no mass.     Tenderness: There is no abdominal tenderness. There is no guarding or rebound.     Hernia: No hernia is present.  Skin:    General: Skin is warm and dry.  Neurological:     Mental Status: She is alert and oriented to person, place, and time.    Previous colonoscopy reports reviewed Assessment  Personal history of colon polyps, family history of colon cancer Plan   Patient is scheduled for a colonoscopy on 11/05/2018.  The risks and benefits of the procedure including bleeding and perforation were fully explained to the patient, who gave informed consent.  GoLYTELY preparation has been prescribed.

## 2018-10-31 NOTE — Progress Notes (Signed)
Maria Price; [id number]; [birth year]   HPI Patient is a 58 year old white female who was referred to my care by Dr. Hilma Favors for a surveillance colonoscopy.  Patient had a colonoscopy in 2017 which did not show any polyps.  Previous colonoscopy in 2014 did show polyps.  She has a strong family history of colon cancer in her grandmother and mother.  She denies any abdominal pain, abnormal diarrhea or constipation, weight loss, or blood per rectum.  She currently has 0 out of 10 abdominal pain.  Her bowel movements have been normal. Past Medical History:  Diagnosis Date  . Complication of anesthesia    slow to wake up  . Headache(784.0)    History of migraines  . Hypercholesteremia   . Hypertension   . Hypothyroidism   . Pneumonia   . Stomach ulcer     Past Surgical History:  Procedure Laterality Date  . BREAST BIOPSY    . COLONOSCOPY    . COLONOSCOPY N/A 04/09/2012   Procedure: COLONOSCOPY;  Surgeon: Jamesetta So, MD;  Location: [facility];  Service: Gastroenterology;  Laterality: N/A;  . COLONOSCOPY  04-09-12  . COLONOSCOPY N/A 12/07/2015   Procedure: COLONOSCOPY;  Surgeon: Aviva Signs, MD;  Location: [facility];  Service: Gastroenterology;  Laterality: N/A;  . DILATION AND CURETTAGE OF UTERUS  1989  . Gland removed from neck    . Hysteroscopy, D&C, Endometrial Ablation  2006  . KNEE ARTHROSCOPY W/ ORIF Right   . OLECRANON BURSECTOMY Left 12/13/2013   Procedure: LEFT ELBOW OLECRANON BURSA;  Surgeon: [name], MD;  Location: [facility];  Service: Orthopedics;  Laterality: Left;  . TONSILLECTOMY  age 34  . TUBAL LIGATION  2002    Family History  Problem Relation Age of Onset  . Colon cancer Mother   . COPD Mother   . Congestive Heart Failure Father   . Hypertension Father   . Thyroid disease Father   . Migraines Father   . Diabetes Brother   . Migraines Paternal Uncle   . Diabetes Paternal Grandmother   . Breast cancer Neg Hx     Current Outpatient Medications on  File Prior to Visit  Medication Sig Dispense Refill  . Cholecalciferol (VITAMIN D3) 50 MCG (2000 UT) capsule Take 2,000 Units by mouth daily.    Marland Kitchen CINNAMON PO Take 1,000 mg by mouth daily.    Marland Kitchen levothyroxine (SYNTHROID, LEVOTHROID) 100 MCG tablet Take 100 mcg by mouth daily before breakfast.    . Magnesium 100 MG CAPS Take 100 mg by mouth daily.    . Multiple Vitamins-Minerals (VITAMIN D3 COMPLETE PO) Take 4,000 Units by mouth daily.    . naproxen sodium (ANAPROX) 220 MG tablet Take 220 mg by mouth 2 (two) times daily as needed (for pain).    . nortriptyline (PAMELOR) 10 MG capsule Take 1 capsule (10 mg total) by mouth at bedtime. 90 capsule 3  . simvastatin (ZOCOR) 20 MG tablet Take 20 mg by mouth at bedtime.    . SUMAtriptan (IMITREX) 100 MG tablet Take 1 tablet (100 mg total) by mouth every 2 (two) hours as needed for migraine. May repeat in 2 hours if headache persists or recurs. 9 tablet 0  . UNABLE TO FIND Med Name:tumeric + fish oil    . ZIAC 5-6.25 MG tablet Take 1 tablet by mouth daily.     No current facility-administered medications on file prior to visit.     Allergies  Allergen Reactions  .  Azithromycin Anaphylaxis  . Levofloxacin Nausea And Vomiting  . Sulfa Drugs Cross Reactors Anaphylaxis    Social History   Substance and Sexual Activity  Alcohol Use Yes  . Alcohol/week: 1.0 standard drinks  . Types: 1 Glasses of wine per week   Comment: occ.    Social History   Tobacco Use  Smoking Status Never Smoker  Smokeless Tobacco Never Used    Review of Systems  Constitutional: Negative.   HENT: Negative.   Eyes: Negative.   Respiratory: Negative.   Cardiovascular: Negative.   Gastrointestinal: Negative.   Genitourinary: Negative.   Musculoskeletal: Negative.   Skin: Negative.   Neurological: Negative.   Endo/Heme/Allergies: Negative.   Psychiatric/Behavioral: Negative.     Objective   Vitals:   10/31/18 0907  BP: 130/86  Pulse: 63  Resp: 16  Temp:  (!) 97.4 F (36.3 C)  SpO2: 96%    Physical Exam Vitals signs reviewed.  Constitutional:      Appearance: Normal appearance. She is normal weight. She is not ill-appearing.  HENT:     Head: Normocephalic and atraumatic.  Cardiovascular:     Rate and Rhythm: Normal rate and regular rhythm.     Heart sounds: Normal heart sounds. No murmur. No friction rub. No gallop.   Pulmonary:     Effort: Pulmonary effort is normal. No respiratory distress.     Breath sounds: Normal breath sounds. No stridor. No wheezing, rhonchi or rales.  Abdominal:     General: Abdomen is flat. Bowel sounds are normal. There is no distension.     Palpations: Abdomen is soft. There is no mass.     Tenderness: There is no abdominal tenderness. There is no guarding or rebound.     Hernia: No hernia is present.  Skin:    General: Skin is warm and dry.  Neurological:     Mental Status: She is alert and oriented to person, place, and time.    Previous colonoscopy reports reviewed Assessment  Personal history of colon polyps, family history of colon cancer Plan   Patient is scheduled for a colonoscopy on 11/05/2018.  The risks and benefits of the procedure including bleeding and perforation were fully explained to the patient, who gave informed consent.  GoLYTELY preparation has been prescribed.

## 2018-10-31 NOTE — Patient Instructions (Signed)
Colonoscopy, Adult A colonoscopy is an exam to look at the entire large intestine. During the exam, a lubricated, flexible tube that has a camera on the end of it is inserted into the anus and then passed into the rectum, colon, and other parts of the large intestine. You may have a colonoscopy as a part of normal colorectal screening or if you have certain symptoms, such as:  Lack of red blood cells (anemia).  Diarrhea that does not go away.  Abdominal pain.  Blood in your stool (feces). A colonoscopy can help screen for and diagnose medical problems, including:  Tumors.  Polyps.  Inflammation.  Areas of bleeding. Tell a health care provider about:  Any allergies you have.  All medicines you are taking, including vitamins, herbs, eye drops, creams, and over-the-counter medicines.  Any problems you or family members have had with anesthetic medicines.  Any blood disorders you have.  Any surgeries you have had.  Any medical conditions you have.  Any problems you have had passing stool. What are the risks? Generally, this is a safe procedure. However, problems may occur, including:  Bleeding.  A tear in the intestine.  A reaction to medicines given during the exam.  Infection (rare). What happens before the procedure? Bowel prep If you were prescribed an oral bowel prep to clean out your colon:  Take it as told by your health care provider. Starting the day before your procedure, you will need to drink a large amount of medicated liquid. The liquid will cause you to have multiple loose stools until your stool is almost clear or light green.  If your skin or anus gets irritated from diarrhea, you may use these to relieve the irritation: ? Medicated wipes, such as adult wet wipes with aloe and vitamin E. ? A skin-soothing product like petroleum jelly.  If you vomit while drinking the bowel prep, take a break for up to 60 minutes and then begin the bowel prep again.  If vomiting continues and you cannot take the bowel prep without vomiting, call your health care provider.  To clean out your colon, you may also be given: ? Laxative medicines. ? Instructions about how to use an enema. General instructions  Ask your health care provider about: ? Changing or stopping your regular medicines or supplements. This is especially important if you are taking iron supplements, diabetes medicines, or blood thinners. ? Taking medicines such as aspirin and ibuprofen. These medicines can thin your blood. Do not take these medicines before the procedure if your health care provider tells you not to.  Plan to have someone take you home from the hospital or clinic. What happens during the procedure?   An IV may be inserted into one of your veins.  You will be given medicine to help you relax (sedative).  To reduce your risk of infection: ? Your health care team will wash or sanitize their hands. ? Your anal area will be washed with soap.  You will be asked to lie on your side with your knees bent.  Your health care provider will lubricate a long, thin, flexible tube. The tube will have a camera and a light on the end.  The tube will be inserted into your anus.  The tube will be gently eased through your rectum and colon.  Air will be delivered into your colon to keep it open. You may feel some pressure or cramping.  The camera will be used to take images during   the procedure.  A small tissue sample may be removed to be examined under a microscope (biopsy).  If small polyps are found, your health care provider may remove them and have them checked for cancer cells.  When the exam is done, the tube will be removed. The procedure may vary among health care providers and hospitals. What happens after the procedure?  Your blood pressure, heart rate, breathing rate, and blood oxygen level will be monitored until the medicines you were given have worn off.  Do  not drive for 24 hours after the exam.  You may have a small amount of blood in your stool.  You may pass gas and have mild abdominal cramping or bloating due to the air that was used to inflate your colon during the exam.  It is up to you to get the results of your procedure. Ask your health care provider, or the department performing the procedure, when your results will be ready. Summary  A colonoscopy is an exam to look at the entire large intestine.  During a colonoscopy, a lubricated, flexible tube with a camera on the end of it is inserted into the anus and then passed into the colon and other parts of the large intestine.  Follow instructions from your health care provider about eating and drinking before the procedure.  If you were prescribed an oral bowel prep to clean out your colon, take it as told by your health care provider.  After your procedure, your blood pressure, heart rate, breathing rate, and blood oxygen level will be monitored until the medicines you were given have worn off. This information is not intended to replace advice given to you by your health care provider. Make sure you discuss any questions you have with your health care provider. Document Released: 12/17/1999 Document Revised: 10/11/2016 Document Reviewed: 03/02/2015 Elsevier Patient Education  2020 Elsevier Inc.  

## 2018-11-01 ENCOUNTER — Other Ambulatory Visit (HOSPITAL_COMMUNITY)
Admission: RE | Admit: 2018-11-01 | Discharge: 2018-11-01 | Disposition: A | Discharge status: Home / Self Care | Payer: PRIVATE HEALTH INSURANCE | Source: Ambulatory Visit | Attending: General Surgery | Admitting: General Surgery

## 2018-11-01 DIAGNOSIS — Z01812 Encounter for preprocedural laboratory examination: Secondary | ICD-10-CM | POA: Insufficient documentation

## 2018-11-01 DIAGNOSIS — Z20828 Contact with and (suspected) exposure to other viral communicable diseases: Secondary | ICD-10-CM | POA: Insufficient documentation

## 2018-11-01 LAB — SARS CORONAVIRUS 2 (TAT 6-24 HRS): SARS Coronavirus 2: NEGATIVE

## 2018-11-05 ENCOUNTER — Ambulatory Visit (HOSPITAL_COMMUNITY)
Admission: RE | Admit: 2018-11-05 | Discharge: 2018-11-05 | Disposition: A | Discharge status: Home / Self Care | Payer: BC Managed Care – PPO | Attending: General Surgery | Admitting: General Surgery

## 2018-11-05 ENCOUNTER — Encounter (HOSPITAL_COMMUNITY)
Admission: RE | Disposition: A | Discharge status: Home / Self Care | Payer: Self-pay | Source: Home / Self Care | Attending: General Surgery

## 2018-11-05 ENCOUNTER — Other Ambulatory Visit: Payer: Self-pay

## 2018-11-05 DIAGNOSIS — Z79899 Other long term (current) drug therapy: Secondary | ICD-10-CM | POA: Diagnosis not present

## 2018-11-05 DIAGNOSIS — Z09 Encounter for follow-up examination after completed treatment for conditions other than malignant neoplasm: Secondary | ICD-10-CM | POA: Diagnosis not present

## 2018-11-05 DIAGNOSIS — Z800 Family history of malignant neoplasm of digestive organs: Secondary | ICD-10-CM | POA: Insufficient documentation

## 2018-11-05 DIAGNOSIS — E7800 Pure hypercholesterolemia, unspecified: Secondary | ICD-10-CM | POA: Insufficient documentation

## 2018-11-05 DIAGNOSIS — E039 Hypothyroidism, unspecified: Secondary | ICD-10-CM | POA: Diagnosis not present

## 2018-11-05 DIAGNOSIS — Z20828 Contact with and (suspected) exposure to other viral communicable diseases: Secondary | ICD-10-CM | POA: Diagnosis not present

## 2018-11-05 DIAGNOSIS — I10 Essential (primary) hypertension: Secondary | ICD-10-CM | POA: Insufficient documentation

## 2018-11-05 DIAGNOSIS — Z860100 Personal history of colon polyps, unspecified: Secondary | ICD-10-CM

## 2018-11-05 DIAGNOSIS — Z79890 Hormone replacement therapy (postmenopausal): Secondary | ICD-10-CM | POA: Insufficient documentation

## 2018-11-05 HISTORY — PX: COLONOSCOPY: SHX5424

## 2018-11-05 SURGERY — COLONOSCOPY
Anesthesia: Moderate Sedation

## 2018-11-05 MED ORDER — MIDAZOLAM HCL 5 MG/5ML IJ SOLN
INTRAMUSCULAR | Status: AC
  Filled 2018-11-05: qty 10

## 2018-11-05 MED ORDER — MIDAZOLAM HCL 5 MG/5ML IJ SOLN
INTRAMUSCULAR | Status: DC | PRN
  Administered 2018-11-05: 2 mg via INTRAVENOUS

## 2018-11-05 MED ORDER — SODIUM CHLORIDE 0.9 % IV SOLN
INTRAVENOUS | Status: DC
  Administered 2018-11-05: 07:00:00 via INTRAVENOUS

## 2018-11-05 MED ORDER — MEPERIDINE HCL 100 MG/ML IJ SOLN
INTRAMUSCULAR | Status: AC
  Filled 2018-11-05: qty 1

## 2018-11-05 MED ORDER — MEPERIDINE HCL 50 MG/ML IJ SOLN
INTRAMUSCULAR | Status: DC | PRN
  Administered 2018-11-05: 50 mg via INTRAVENOUS

## 2018-11-05 NOTE — Interval H&P Note (Signed)
History and Physical Interval Note:  11/05/2018 7:16 AM  Maria Price  has presented today for surgery, with the diagnosis of PERSONAL HISTORY OF COLON POLYPS.  The various methods of treatment have been discussed with the patient and family. After consideration of risks, benefits and other options for treatment, the patient has consented to  Procedure(s) with comments: COLONOSCOPY (N/A) - 730am as a surgical intervention.  The patient's history has been reviewed, patient examined, no change in status, stable for surgery.  I have reviewed the patient's chart and labs.  Questions were answered to the patient's satisfaction.     Aviva Signs

## 2018-11-05 NOTE — Discharge Instructions (Signed)
Colonoscopy, Adult, Care After °This sheet gives you information about how to care for yourself after your procedure. Your health care provider may also give you more specific instructions. If you have problems or questions, contact your health care provider. °What can I expect after the procedure? °After the procedure, it is common to have: °· A small amount of blood in your stool for 24 hours after the procedure. °· Some gas. °· Mild abdominal cramping or bloating. °Follow these instructions at home: °General instructions °· For the first 24 hours after the procedure: °? Do not drive or use machinery. °? Do not sign important documents. °? Do not drink alcohol. °? Do your regular daily activities at a slower pace than normal. °? Eat soft, easy-to-digest foods. °· Take over-the-counter or prescription medicines only as told by your health care provider. °Relieving cramping and bloating ° °· Try walking around when you have cramps or feel bloated. °· Apply heat to your abdomen as told by your health care provider. Use a heat source that your health care provider recommends, such as a moist heat pack or a heating pad. °? Place a towel between your skin and the heat source. °? Leave the heat on for 20-30 minutes. °? Remove the heat if your skin turns bright red. This is especially important if you are unable to feel pain, heat, or cold. You may have a greater risk of getting burned. °Eating and drinking ° °· Drink enough fluid to keep your urine pale yellow. °· Resume your normal diet as instructed by your health care provider. Avoid heavy or fried foods that are hard to digest. °· Avoid drinking alcohol for as long as instructed by your health care provider. °Contact a health care provider if: °· You have blood in your stool 2-3 days after the procedure. °Get help right away if: °· You have more than a small spotting of blood in your stool. °· You pass large blood clots in your stool. °· Your abdomen is  swollen. °· You have nausea or vomiting. °· You have a fever. °· You have increasing abdominal pain that is not relieved with medicine. °Summary °· After the procedure, it is common to have a small amount of blood in your stool. You may also have mild abdominal cramping and bloating. °· For the first 24 hours after the procedure, do not drive or use machinery, sign important documents, or drink alcohol. °· Contact your health care provider if you have a lot of blood in your stool, nausea or vomiting, a fever, or increased abdominal pain. °This information is not intended to replace advice given to you by your health care provider. Make sure you discuss any questions you have with your health care provider. °Document Released: 08/03/2003 Document Revised: 10/11/2016 Document Reviewed: 03/02/2015 °Elsevier Patient Education © 2020 Elsevier Inc. ° °

## 2018-11-05 NOTE — Op Note (Signed)
[facility] Patient Name: Maria Price Procedure Date: 11/05/2018 7:01 AM MRN: [id number] Date of Birth: [birth year] Attending MD: Aviva Signs , MD CSN: [id number] Age: 58 Admit Type: Outpatient Procedure:                Colonoscopy Indications:              High risk colon cancer surveillance: Personal                            history of colonic polyps Providers:                Aviva Signs, MD, Charlsie Quest. Theda Sers RN, RN, Aram Candela Referring MD:              Medicines:                Midazolam 2 mg IV, Meperidine 50 mg IV Complications:            No immediate complications. Estimated blood loss:                            None. Estimated Blood Loss:     Estimated blood loss: none. Procedure:                Pre-Anesthesia Assessment:                           - Prior to the procedure, a History and Physical                            was performed, and patient medications and                            allergies were reviewed. The patient is competent.                            The risks and benefits of the procedure and the                            sedation options and risks were discussed with the                            patient. All questions were answered and informed                            consent was obtained. Patient identification and                            proposed procedure were verified by the physician,                            the nurse and the technician in the endoscopy  suite. Mental Status Examination: alert and                            oriented. Airway Examination: normal oropharyngeal                            airway and neck mobility. Respiratory Examination:                            clear to auscultation. CV Examination: RRR, no                            murmurs, no S3 or S4. Prophylactic Antibiotics: The                            patient does not require prophylactic  antibiotics.                            Prior Anticoagulants: The patient has taken no                            previous anticoagulant or antiplatelet agents. ASA                            Grade Assessment: II - A patient with mild systemic                            disease. After reviewing the risks and benefits,                            the patient was deemed in satisfactory condition to                            undergo the procedure. The anesthesia plan was to                            use moderate sedation / analgesia (conscious                            sedation). Immediately prior to administration of                            medications, the patient was re-assessed for                            adequacy to receive sedatives. The heart rate,                            respiratory rate, oxygen saturations, blood                            pressure, adequacy of pulmonary ventilation, and  response to care were monitored throughout the                            procedure. The physical status of the patient was                            re-assessed after the procedure.                           After obtaining informed consent, the colonoscope                            was passed under direct vision. Throughout the                            procedure, the patient's blood pressure, pulse, and                            oxygen saturations were monitored continuously. The                            [id number] scope was introduced through                            the anus and advanced to the the cecum, identified                            by the appendiceal orifice, ileocecal valve and                            palpation. No anatomical landmarks were                            photographed. The entire colon was well visualized.                            The colonoscopy was performed without difficulty.                            The  patient tolerated the procedure well. The                            quality of the bowel preparation was adequate. The                            total duration of the procedure was 12 minutes. Scope In: 7:22:17 AM Scope Out: 7:32:13 AM Scope Withdrawal Time: 0 hours 5 minutes 58 seconds  Total Procedure Duration: 0 hours 9 minutes 56 seconds  Findings:      The perianal and digital rectal examinations were normal.      The entire examined colon appeared normal on direct and retroflexion       views. Impression:               - The entire examined colon is normal on  direct and                            retroflexion views.                           - No specimens collected. Moderate Sedation:      Moderate (conscious) sedation was administered by the endoscopy nurse       and supervised by the endoscopist. The following parameters were       monitored: oxygen saturation, heart rate, blood pressure, and response       to care. Recommendation:           - Patient has a contact number available for                            emergencies. The signs and symptoms of potential                            delayed complications were discussed with the                            patient. Return to normal activities tomorrow.                            Written discharge instructions were provided to the                            patient.                           - Written discharge instructions were provided to                            the patient.                           - The signs and symptoms of potential delayed                            complications were discussed with the patient.                           - Patient has a contact number available for                            emergencies.                           - Return to normal activities tomorrow.                           - Resume previous diet.                           - Continue present medications.                            -  Repeat colonoscopy in 5 years for screening                            purposes. Procedure Code(s):        --- Professional ---                           [phone], Colonoscopy, flexible; diagnostic, including                            collection of specimen(s) by brushing or washing,                            when performed (separate procedure) Diagnosis Code(s):        --- Professional ---                           Z86.010, Personal history of colonic polyps CPT copyright 2019 American Medical Association. All rights reserved. The codes documented in this report are preliminary and upon coder review may  be revised to meet current compliance requirements. Aviva Signs, MD Aviva Signs, MD 11/05/2018 7:46:03 AM This report has been signed electronically. Number of Addenda: 0

## 2018-11-08 ENCOUNTER — Encounter (HOSPITAL_COMMUNITY): Payer: Self-pay | Admitting: General Surgery

## 2018-11-13 ENCOUNTER — Encounter: Payer: Self-pay | Admitting: Adult Health

## 2018-11-13 ENCOUNTER — Ambulatory Visit (INDEPENDENT_AMBULATORY_CARE_PROVIDER_SITE_OTHER): Payer: BC Managed Care – PPO | Admitting: Adult Health

## 2018-11-13 ENCOUNTER — Other Ambulatory Visit (HOSPITAL_COMMUNITY)
Admission: RE | Admit: 2018-11-13 | Discharge: 2018-11-13 | Disposition: A | Discharge status: Home / Self Care | Payer: BC Managed Care – PPO | Source: Ambulatory Visit | Attending: Adult Health | Admitting: Adult Health

## 2018-11-13 ENCOUNTER — Other Ambulatory Visit: Payer: Self-pay

## 2018-11-13 VITALS — BP 129/78 | HR 61 | Ht 67.0 in | Wt 181.0 lb

## 2018-11-13 DIAGNOSIS — Z01419 Encounter for gynecological examination (general) (routine) without abnormal findings: Secondary | ICD-10-CM

## 2018-11-13 NOTE — Progress Notes (Signed)
Patient ID: Janaiyah Feo, female   DOB: [birth year], 58 y.o.   MRN: [id number] History of Present Illness: Uraina is a 58 year old white female, married, PM in for a well woman gyn exam and pap,. PCP is Dr Hilma Favors.    Current Medications, Allergies, Past Medical History, Past Surgical History, Family History and Social History were reviewed in [employer] record.     Review of Systems: Patient denies any headaches, hearing loss, fatigue, blurred vision, shortness of breath, chest pain, abdominal pain, problems with bowel movements, urination, or intercourse(not active). No joint pain or mood swings.    Physical Exam:BP 129/78 (BP Location: Left Arm, Patient Position: Sitting, Cuff Size: Normal)   Pulse 61   Ht 5\' 7"  (1.702 m)   Wt 181 lb (82.1 kg)   BMI 28.35 kg/m  General:  Well developed, well nourished, no acute distress Skin:  Warm and dry Neck:  Midline trachea, normal thyroid, good ROM, no lymphadenopathy Lungs; Clear to auscultation bilaterally Breast:  No dominant palpable mass, retraction, or nipple discharge Cardiovascular: Regular rate and rhythm Abdomen:  Soft, non tender, no hepatosplenomegaly Pelvic:  External genitalia is normal in appearance, no lesions.  The vagina is normal in appearance. Urethra has no lesions or masses. The cervix is smooth, pap with hight risk HPV 16/18 genotyping performed.Marland Kitchen  Uterus is felt to be normal size, shape, and contour.  No adnexal masses or tenderness noted.Bladder is non tender, no masses felt. Rectal:Deferred had colonoscopy last week. Extremities/musculoskeletal:  No swelling or varicosities noted, no clubbing or cyanosis Psych:  No mood changes, alert and cooperative,seems happy Fall risk is low PHQ 2 score is 0. Co exam with Weyman Croon FNP student.   Impression and Plan: 1. Encounter for gynecological examination with Papanicolaou smear of cervix Pap sent Physical in 1 year Mammogram every 1-2  years Lab with PCP  Colonoscopy in 5 years

## 2018-11-19 LAB — CYTOLOGY - PAP
Comment: NEGATIVE
Diagnosis: NEGATIVE
High risk HPV: NEGATIVE

## 2019-01-01 DIAGNOSIS — Z03818 Encounter for observation for suspected exposure to other biological agents ruled out: Secondary | ICD-10-CM | POA: Diagnosis not present

## 2019-01-01 DIAGNOSIS — R438 Other disturbances of smell and taste: Secondary | ICD-10-CM | POA: Diagnosis not present

## 2019-01-01 DIAGNOSIS — R519 Headache, unspecified: Secondary | ICD-10-CM | POA: Diagnosis not present

## 2019-01-20 ENCOUNTER — Ambulatory Visit: Payer: PRIVATE HEALTH INSURANCE | Admitting: Neurology

## 2019-01-22 ENCOUNTER — Other Ambulatory Visit: Payer: Self-pay | Admitting: Family Medicine

## 2019-01-22 DIAGNOSIS — Z1231 Encounter for screening mammogram for malignant neoplasm of breast: Secondary | ICD-10-CM

## 2019-01-28 ENCOUNTER — Telehealth (INDEPENDENT_AMBULATORY_CARE_PROVIDER_SITE_OTHER): Payer: BC Managed Care – PPO | Admitting: Neurology

## 2019-01-28 DIAGNOSIS — G43009 Migraine without aura, not intractable, without status migrainosus: Secondary | ICD-10-CM | POA: Diagnosis not present

## 2019-01-28 NOTE — Progress Notes (Addendum)
PATIENT: Maria Price DOB: 1960-04-06  Virtual Visit via video  I connected with Maria Price on 01/28/19 at  by video and verified that I am speaking with the correct person using two identifiers.   I discussed the limitations, risks, security and privacy concerns of performing an evaluation and management service by video and the availability of in person appointments. I also discussed with the patient that there may be a patient responsible charge related to this service. The patient expressed understanding and agreed to proceed.  HISTORICAL Maria Price, is a 59 year old female follow-up for migraine headaches, last visit was with Maria Price in January 2020.   She had a history of hypothyroidism, on supplement, she has a history of migraine headache as long as she can remember.  Her typical migraine is right retro-orbital severe piercing pain lasting 30 minutes with associated photophobia and phonophobia relieved by lying in a dark quiet room. She denies motor or sensory deficits. Her triggers can be weather changes lack of sleep,  perfumes and windy weather.  She says menopause has ended and her migraines are decreased  She has been treated with nortriptyline 10 mg every night as migraine prevention, she complains of dry mouth, still has mild difficulty sleeping sometimes, but she no longer has migraine headaches, has been taking Imitrex for 2 years,  Observations/Objective: I have reviewed problem lists, medications, allergies.  Awake alert, oriented to history taking care of conversation, facial were symmetric, moving 4 extremities without difficulties,  Assessment and Plan: Chronic migraine  Much improved, I have advised her to consider taper off nortriptyline as preventive medications  Intermittent insomnia  We also discussed treatment options, emphasized importance of regular sleep routine, moderate exercise, may try over-the-counter melatonin as needed  Follow Up  Instructions:  As needed    I discussed the assessment and treatment plan with the patient. The patient was provided an opportunity to ask questions and all were answered. The patient agreed with the plan and demonstrated an understanding of the instructions.   The patient was advised to call back or seek an in-person evaluation if the symptoms worsen or if the condition fails to improve as anticipated.  I provided 20 minutes of non-face-to-face time during this encounter.  REVIEW OF SYSTEMS: Full 14 system review of systems performed and notable only for as above All other review of systems were negative.  ALLERGIES: Allergies  Allergen Reactions  . Azithromycin Anaphylaxis  . Levofloxacin Nausea And Vomiting  . Sulfa Drugs Cross Reactors Anaphylaxis    HOME MEDICATIONS: Current Outpatient Medications  Medication Sig Dispense Refill  . Cholecalciferol (VITAMIN D3) 50 MCG (2000 UT) capsule Take 2,000 Units by mouth daily.    Marland Kitchen CINNAMON PO Take 1,000 mg by mouth daily.    Marland Kitchen levothyroxine (SYNTHROID, LEVOTHROID) 100 MCG tablet Take 100 mcg by mouth daily before breakfast.    . Magnesium 100 MG CAPS Take 100 mg by mouth daily.    . naproxen sodium (ANAPROX) 220 MG tablet Take 220 mg by mouth 2 (two) times daily as needed (for pain).    . nortriptyline (PAMELOR) 10 MG capsule Take 1 capsule (10 mg total) by mouth at bedtime. 90 capsule 3  . Omega-3 Fatty Acids (FISH OIL) 1200 MG CAPS Take 1,200 mg by mouth daily.    . simvastatin (ZOCOR) 20 MG tablet Take 20 mg by mouth at bedtime.    . SUMAtriptan (IMITREX) 100 MG tablet Take 1 tablet (100 mg total) by mouth every  2 (two) hours as needed for migraine. May repeat in 2 hours if headache persists or recurs. 9 tablet 0  . ZIAC 5-6.25 MG tablet Take 1 tablet by mouth daily.     No current facility-administered medications for this visit.    PAST MEDICAL HISTORY: Past Medical History:  Diagnosis Date  . Complication of anesthesia     slow to wake up  . Headache(784.0)    History of migraines  . Hypercholesteremia   . Hypertension   . Hypothyroidism   . Pneumonia   . Stomach ulcer     PAST SURGICAL HISTORY: Past Surgical History:  Procedure Laterality Date  . BREAST BIOPSY    . COLONOSCOPY    . COLONOSCOPY N/A 04/09/2012   Procedure: COLONOSCOPY;  Surgeon: Jamesetta So, MD;  [facility];  Service: Gastroenterology;  Laterality: N/A;  . COLONOSCOPY  04-09-12  . COLONOSCOPY N/A 12/07/2015   Procedure: COLONOSCOPY;  Surgeon: Aviva Signs, MD;  Location: [facility];  Service: Gastroenterology;  Laterality: N/A;  . COLONOSCOPY N/A 11/05/2018   Procedure: COLONOSCOPY;  Surgeon: Aviva Signs, MD;  Location: [facility];  Service: General;  Laterality: N/A;  730am  . [place]  . Gland removed from neck    . Hysteroscopy, D&C, Endometrial Ablation  2006  . KNEE ARTHROSCOPY W/ ORIF Right   . OLECRANON BURSECTOMY Left 12/13/2013   Procedure: LEFT ELBOW OLECRANON BURSA;  Surgeon: [name], MD;  Location: [facility];  Service: Orthopedics;  Laterality: Left;  . TONSILLECTOMY  age 38  . TUBAL LIGATION  2002    FAMILY HISTORY: Family History  Problem Relation Age of Onset  . Colon cancer Mother   . COPD Mother   . Congestive Heart Failure Father   . Hypertension Father   . Thyroid disease Father   . Migraines Father   . Diabetes Brother   . Migraines Paternal Uncle   . Diabetes Paternal Grandmother   . Breast cancer Neg Hx     SOCIAL HISTORY:   Social History   Socioeconomic History  . Marital status: Married    Spouse name: Not on file  . Number of children: Not on file  . Years of education: Not on file  . Highest education level: Not on file  Occupational History  . Not on file  Tobacco Use  . Smoking status: Never Smoker  . Smokeless tobacco: Never Used  Substance and Sexual Activity  . Alcohol use: Yes    Alcohol/week: 1.0 standard drinks    Types:  1 Glasses of wine per week    Comment: occ.  . Drug use: No  . Sexual activity: Not Currently    Birth control/protection: Surgical, Post-menopausal    Comment: tubal  Other Topics Concern  . Not on file  Social History Narrative  . Not on file   Social Determinants of Health   Financial Resource Strain:   . Difficulty of Paying Living Expenses: Not on file  Food Insecurity:   . Worried About Charity fundraiser in the Last Year: Not on file  . Ran Out of Food in the Last Year: Not on file  Transportation Needs:   . Lack of Transportation (Medical): Not on file  . Lack of Transportation (Non-Medical): Not on file  Physical Activity:   . Days of Exercise per Week: Not on file  . Minutes of Exercise per Session: Not on file  Stress:   .  Feeling of Stress : Not on file  Social Connections:   . Frequency of Communication with Friends and Family: Not on file  . Frequency of Social Gatherings with Friends and Family: Not on file  . Attends Religious Services: Not on file  . Active Member of Clubs or Organizations: Not on file  . Attends Archivist Meetings: Not on file  . Marital Status: Not on file  Intimate Partner Violence:   . Fear of Current or Ex-Partner: Not on file  . Emotionally Abused: Not on file  . Physically Abused: Not on file  . Sexually Abused: Not on file    Marcial Pacas, M.D. Ph.D.  [facility] [address] [place], [place] [address] Ph: [phone] Fax: [phone]  CC: Referring Provider

## 2019-01-28 NOTE — Addendum Note (Signed)
Addended by: Marcial Pacas on: 01/28/2019 08:38 AM   Modules accepted: Level of Service

## 2019-02-17 DIAGNOSIS — Z1283 Encounter for screening for malignant neoplasm of skin: Secondary | ICD-10-CM | POA: Diagnosis not present

## 2019-02-17 DIAGNOSIS — D225 Melanocytic nevi of trunk: Secondary | ICD-10-CM | POA: Diagnosis not present

## 2019-02-17 DIAGNOSIS — B078 Other viral warts: Secondary | ICD-10-CM | POA: Diagnosis not present

## 2019-02-26 ENCOUNTER — Ambulatory Visit
Admission: RE | Admit: 2019-02-26 | Discharge: 2019-02-26 | Disposition: A | Discharge status: Home / Self Care | Payer: BC Managed Care – PPO | Source: Ambulatory Visit | Attending: Family Medicine | Admitting: Family Medicine

## 2019-02-26 ENCOUNTER — Other Ambulatory Visit: Payer: Self-pay

## 2019-02-26 DIAGNOSIS — Z1231 Encounter for screening mammogram for malignant neoplasm of breast: Secondary | ICD-10-CM | POA: Diagnosis not present

## 2019-04-16 DIAGNOSIS — Z1389 Encounter for screening for other disorder: Secondary | ICD-10-CM | POA: Diagnosis not present

## 2019-04-16 DIAGNOSIS — E559 Vitamin D deficiency, unspecified: Secondary | ICD-10-CM | POA: Diagnosis not present

## 2019-04-16 DIAGNOSIS — E782 Mixed hyperlipidemia: Secondary | ICD-10-CM | POA: Diagnosis not present

## 2019-04-16 DIAGNOSIS — I10 Essential (primary) hypertension: Secondary | ICD-10-CM | POA: Diagnosis not present

## 2019-04-16 DIAGNOSIS — R7309 Other abnormal glucose: Secondary | ICD-10-CM | POA: Diagnosis not present

## 2019-04-16 DIAGNOSIS — Z0001 Encounter for general adult medical examination with abnormal findings: Secondary | ICD-10-CM | POA: Diagnosis not present

## 2019-04-16 DIAGNOSIS — Z6829 Body mass index (BMI) 29.0-29.9, adult: Secondary | ICD-10-CM | POA: Diagnosis not present

## 2019-04-16 DIAGNOSIS — E039 Hypothyroidism, unspecified: Secondary | ICD-10-CM | POA: Diagnosis not present

## 2019-10-13 DIAGNOSIS — E559 Vitamin D deficiency, unspecified: Secondary | ICD-10-CM | POA: Diagnosis not present

## 2019-10-13 DIAGNOSIS — E7849 Other hyperlipidemia: Secondary | ICD-10-CM | POA: Diagnosis not present

## 2019-10-13 DIAGNOSIS — Z23 Encounter for immunization: Secondary | ICD-10-CM | POA: Diagnosis not present

## 2019-10-13 DIAGNOSIS — E039 Hypothyroidism, unspecified: Secondary | ICD-10-CM | POA: Diagnosis not present

## 2019-10-13 DIAGNOSIS — I10 Essential (primary) hypertension: Secondary | ICD-10-CM | POA: Diagnosis not present

## 2019-10-13 DIAGNOSIS — Z6829 Body mass index (BMI) 29.0-29.9, adult: Secondary | ICD-10-CM | POA: Diagnosis not present

## 2019-11-24 DIAGNOSIS — Z23 Encounter for immunization: Secondary | ICD-10-CM | POA: Diagnosis not present

## 2020-02-16 ENCOUNTER — Other Ambulatory Visit: Payer: Self-pay | Admitting: Family Medicine

## 2020-02-16 DIAGNOSIS — Z1231 Encounter for screening mammogram for malignant neoplasm of breast: Secondary | ICD-10-CM

## 2020-04-16 ENCOUNTER — Ambulatory Visit
Admission: RE | Admit: 2020-04-16 | Discharge: 2020-04-16 | Disposition: A | Discharge status: Home / Self Care | Payer: BC Managed Care – PPO | Source: Ambulatory Visit | Attending: Family Medicine | Admitting: Family Medicine

## 2020-04-16 ENCOUNTER — Other Ambulatory Visit: Payer: Self-pay

## 2020-04-16 DIAGNOSIS — Z1231 Encounter for screening mammogram for malignant neoplasm of breast: Secondary | ICD-10-CM | POA: Diagnosis not present

## 2020-06-01 DIAGNOSIS — I10 Essential (primary) hypertension: Secondary | ICD-10-CM | POA: Diagnosis not present

## 2020-06-01 DIAGNOSIS — Z23 Encounter for immunization: Secondary | ICD-10-CM | POA: Diagnosis not present

## 2020-06-01 DIAGNOSIS — R7309 Other abnormal glucose: Secondary | ICD-10-CM | POA: Diagnosis not present

## 2020-06-01 DIAGNOSIS — Z1331 Encounter for screening for depression: Secondary | ICD-10-CM | POA: Diagnosis not present

## 2020-06-01 DIAGNOSIS — E559 Vitamin D deficiency, unspecified: Secondary | ICD-10-CM | POA: Diagnosis not present

## 2020-06-01 DIAGNOSIS — E7849 Other hyperlipidemia: Secondary | ICD-10-CM | POA: Diagnosis not present

## 2020-06-01 DIAGNOSIS — Z1389 Encounter for screening for other disorder: Secondary | ICD-10-CM | POA: Diagnosis not present

## 2020-06-01 DIAGNOSIS — Z0001 Encounter for general adult medical examination with abnormal findings: Secondary | ICD-10-CM | POA: Diagnosis not present

## 2020-06-01 DIAGNOSIS — E6609 Other obesity due to excess calories: Secondary | ICD-10-CM | POA: Diagnosis not present

## 2020-06-01 DIAGNOSIS — Z6830 Body mass index (BMI) 30.0-30.9, adult: Secondary | ICD-10-CM | POA: Diagnosis not present

## 2020-06-01 DIAGNOSIS — E039 Hypothyroidism, unspecified: Secondary | ICD-10-CM | POA: Diagnosis not present

## 2020-07-01 DIAGNOSIS — Z23 Encounter for immunization: Secondary | ICD-10-CM | POA: Diagnosis not present

## 2020-08-31 DIAGNOSIS — R0683 Snoring: Secondary | ICD-10-CM | POA: Diagnosis not present

## 2020-08-31 DIAGNOSIS — Z6830 Body mass index (BMI) 30.0-30.9, adult: Secondary | ICD-10-CM | POA: Diagnosis not present

## 2020-08-31 DIAGNOSIS — E6609 Other obesity due to excess calories: Secondary | ICD-10-CM | POA: Diagnosis not present

## 2020-08-31 DIAGNOSIS — Z23 Encounter for immunization: Secondary | ICD-10-CM | POA: Diagnosis not present

## 2020-12-07 DIAGNOSIS — K635 Polyp of colon: Secondary | ICD-10-CM | POA: Diagnosis not present

## 2020-12-07 DIAGNOSIS — R7309 Other abnormal glucose: Secondary | ICD-10-CM | POA: Diagnosis not present

## 2020-12-07 DIAGNOSIS — E039 Hypothyroidism, unspecified: Secondary | ICD-10-CM | POA: Diagnosis not present

## 2020-12-07 DIAGNOSIS — E782 Mixed hyperlipidemia: Secondary | ICD-10-CM | POA: Diagnosis not present

## 2020-12-07 DIAGNOSIS — Z6831 Body mass index (BMI) 31.0-31.9, adult: Secondary | ICD-10-CM | POA: Diagnosis not present

## 2020-12-07 DIAGNOSIS — E559 Vitamin D deficiency, unspecified: Secondary | ICD-10-CM | POA: Diagnosis not present

## 2020-12-07 DIAGNOSIS — E7849 Other hyperlipidemia: Secondary | ICD-10-CM | POA: Diagnosis not present

## 2020-12-07 DIAGNOSIS — I10 Essential (primary) hypertension: Secondary | ICD-10-CM | POA: Diagnosis not present

## 2020-12-07 DIAGNOSIS — E6609 Other obesity due to excess calories: Secondary | ICD-10-CM | POA: Diagnosis not present

## 2020-12-07 DIAGNOSIS — Z1389 Encounter for screening for other disorder: Secondary | ICD-10-CM | POA: Diagnosis not present

## 2021-05-04 ENCOUNTER — Other Ambulatory Visit: Payer: Self-pay | Admitting: Family Medicine

## 2021-05-04 DIAGNOSIS — Z1231 Encounter for screening mammogram for malignant neoplasm of breast: Secondary | ICD-10-CM

## 2021-05-05 DIAGNOSIS — E039 Hypothyroidism, unspecified: Secondary | ICD-10-CM | POA: Diagnosis not present

## 2021-05-05 DIAGNOSIS — E782 Mixed hyperlipidemia: Secondary | ICD-10-CM | POA: Diagnosis not present

## 2021-05-05 DIAGNOSIS — E7849 Other hyperlipidemia: Secondary | ICD-10-CM | POA: Diagnosis not present

## 2021-05-05 DIAGNOSIS — E119 Type 2 diabetes mellitus without complications: Secondary | ICD-10-CM | POA: Diagnosis not present

## 2021-05-05 DIAGNOSIS — E663 Overweight: Secondary | ICD-10-CM | POA: Diagnosis not present

## 2021-05-05 DIAGNOSIS — E559 Vitamin D deficiency, unspecified: Secondary | ICD-10-CM | POA: Diagnosis not present

## 2021-05-05 DIAGNOSIS — Z6829 Body mass index (BMI) 29.0-29.9, adult: Secondary | ICD-10-CM | POA: Diagnosis not present

## 2021-05-05 DIAGNOSIS — I10 Essential (primary) hypertension: Secondary | ICD-10-CM | POA: Diagnosis not present

## 2021-05-05 DIAGNOSIS — R7309 Other abnormal glucose: Secondary | ICD-10-CM | POA: Diagnosis not present

## 2021-06-03 ENCOUNTER — Ambulatory Visit
Admission: RE | Admit: 2021-06-03 | Discharge: 2021-06-03 | Disposition: A | Discharge status: Home / Self Care | Payer: BC Managed Care – PPO | Source: Ambulatory Visit | Attending: Family Medicine | Admitting: Family Medicine

## 2021-06-03 DIAGNOSIS — Z1231 Encounter for screening mammogram for malignant neoplasm of breast: Secondary | ICD-10-CM

## 2021-11-15 DIAGNOSIS — E119 Type 2 diabetes mellitus without complications: Secondary | ICD-10-CM | POA: Diagnosis not present

## 2021-11-15 DIAGNOSIS — K635 Polyp of colon: Secondary | ICD-10-CM | POA: Diagnosis not present

## 2021-11-15 DIAGNOSIS — Z800 Family history of malignant neoplasm of digestive organs: Secondary | ICD-10-CM | POA: Diagnosis not present

## 2021-11-15 DIAGNOSIS — E039 Hypothyroidism, unspecified: Secondary | ICD-10-CM | POA: Diagnosis not present

## 2021-11-15 DIAGNOSIS — Z6830 Body mass index (BMI) 30.0-30.9, adult: Secondary | ICD-10-CM | POA: Diagnosis not present

## 2021-11-15 DIAGNOSIS — E6609 Other obesity due to excess calories: Secondary | ICD-10-CM | POA: Diagnosis not present

## 2021-11-15 DIAGNOSIS — E559 Vitamin D deficiency, unspecified: Secondary | ICD-10-CM | POA: Diagnosis not present

## 2021-11-15 DIAGNOSIS — I10 Essential (primary) hypertension: Secondary | ICD-10-CM | POA: Diagnosis not present

## 2021-11-15 DIAGNOSIS — E782 Mixed hyperlipidemia: Secondary | ICD-10-CM | POA: Diagnosis not present

## 2021-11-15 DIAGNOSIS — Z1331 Encounter for screening for depression: Secondary | ICD-10-CM | POA: Diagnosis not present

## 2021-11-15 DIAGNOSIS — Z0001 Encounter for general adult medical examination with abnormal findings: Secondary | ICD-10-CM | POA: Diagnosis not present

## 2022-01-30 DIAGNOSIS — Z6830 Body mass index (BMI) 30.0-30.9, adult: Secondary | ICD-10-CM | POA: Diagnosis not present

## 2022-01-30 DIAGNOSIS — E6609 Other obesity due to excess calories: Secondary | ICD-10-CM | POA: Diagnosis not present

## 2022-01-30 DIAGNOSIS — J069 Acute upper respiratory infection, unspecified: Secondary | ICD-10-CM | POA: Diagnosis not present

## 2022-07-20 DIAGNOSIS — E039 Hypothyroidism, unspecified: Secondary | ICD-10-CM | POA: Diagnosis not present

## 2022-07-20 DIAGNOSIS — E6609 Other obesity due to excess calories: Secondary | ICD-10-CM | POA: Diagnosis not present

## 2022-07-20 DIAGNOSIS — E119 Type 2 diabetes mellitus without complications: Secondary | ICD-10-CM | POA: Diagnosis not present

## 2022-07-20 DIAGNOSIS — R7309 Other abnormal glucose: Secondary | ICD-10-CM | POA: Diagnosis not present

## 2022-07-20 DIAGNOSIS — I10 Essential (primary) hypertension: Secondary | ICD-10-CM | POA: Diagnosis not present

## 2022-07-20 DIAGNOSIS — Z6829 Body mass index (BMI) 29.0-29.9, adult: Secondary | ICD-10-CM | POA: Diagnosis not present

## 2022-07-20 DIAGNOSIS — E559 Vitamin D deficiency, unspecified: Secondary | ICD-10-CM | POA: Diagnosis not present

## 2022-07-20 DIAGNOSIS — E782 Mixed hyperlipidemia: Secondary | ICD-10-CM | POA: Diagnosis not present

## 2022-07-20 DIAGNOSIS — E7849 Other hyperlipidemia: Secondary | ICD-10-CM | POA: Diagnosis not present

## 2022-08-01 ENCOUNTER — Other Ambulatory Visit: Payer: Self-pay | Admitting: Family Medicine

## 2022-08-01 DIAGNOSIS — Z0000 Encounter for general adult medical examination without abnormal findings: Secondary | ICD-10-CM

## 2022-08-09 ENCOUNTER — Ambulatory Visit: Admission: RE | Admit: 2022-08-09 | Payer: BC Managed Care – PPO | Source: Ambulatory Visit

## 2022-08-09 DIAGNOSIS — Z0000 Encounter for general adult medical examination without abnormal findings: Secondary | ICD-10-CM

## 2022-08-09 DIAGNOSIS — Z1231 Encounter for screening mammogram for malignant neoplasm of breast: Secondary | ICD-10-CM | POA: Diagnosis not present

## 2023-01-30 ENCOUNTER — Other Ambulatory Visit (HOSPITAL_COMMUNITY): Payer: Self-pay | Admitting: Family Medicine

## 2023-01-30 DIAGNOSIS — R7309 Other abnormal glucose: Secondary | ICD-10-CM | POA: Diagnosis not present

## 2023-01-30 DIAGNOSIS — E039 Hypothyroidism, unspecified: Secondary | ICD-10-CM | POA: Diagnosis not present

## 2023-01-30 DIAGNOSIS — E7849 Other hyperlipidemia: Secondary | ICD-10-CM

## 2023-01-30 DIAGNOSIS — E663 Overweight: Secondary | ICD-10-CM | POA: Diagnosis not present

## 2023-01-30 DIAGNOSIS — K635 Polyp of colon: Secondary | ICD-10-CM | POA: Diagnosis not present

## 2023-01-30 DIAGNOSIS — Z6829 Body mass index (BMI) 29.0-29.9, adult: Secondary | ICD-10-CM | POA: Diagnosis not present

## 2023-01-30 DIAGNOSIS — E782 Mixed hyperlipidemia: Secondary | ICD-10-CM | POA: Diagnosis not present

## 2023-01-30 DIAGNOSIS — Z1331 Encounter for screening for depression: Secondary | ICD-10-CM | POA: Diagnosis not present

## 2023-01-30 DIAGNOSIS — E559 Vitamin D deficiency, unspecified: Secondary | ICD-10-CM | POA: Diagnosis not present

## 2023-01-30 DIAGNOSIS — E119 Type 2 diabetes mellitus without complications: Secondary | ICD-10-CM | POA: Diagnosis not present

## 2023-01-30 DIAGNOSIS — I10 Essential (primary) hypertension: Secondary | ICD-10-CM | POA: Diagnosis not present

## 2023-01-30 DIAGNOSIS — Z0001 Encounter for general adult medical examination with abnormal findings: Secondary | ICD-10-CM | POA: Diagnosis not present

## 2023-02-09 ENCOUNTER — Ambulatory Visit (HOSPITAL_COMMUNITY)
Admission: RE | Admit: 2023-02-09 | Discharge: 2023-02-09 | Disposition: A | Discharge status: Home / Self Care | Payer: Self-pay | Source: Ambulatory Visit | Attending: Family Medicine | Admitting: Family Medicine

## 2023-02-09 DIAGNOSIS — E7849 Other hyperlipidemia: Secondary | ICD-10-CM | POA: Insufficient documentation

## 2023-02-09 DIAGNOSIS — R7309 Other abnormal glucose: Secondary | ICD-10-CM | POA: Insufficient documentation

## 2023-03-15 ENCOUNTER — Encounter: Payer: Self-pay | Admitting: Adult Health

## 2023-03-15 ENCOUNTER — Ambulatory Visit (INDEPENDENT_AMBULATORY_CARE_PROVIDER_SITE_OTHER): Payer: BC Managed Care – PPO | Admitting: Adult Health

## 2023-03-15 ENCOUNTER — Other Ambulatory Visit (HOSPITAL_COMMUNITY)
Admission: RE | Admit: 2023-03-15 | Discharge: 2023-03-15 | Disposition: A | Discharge status: Home / Self Care | Source: Ambulatory Visit | Attending: Adult Health | Admitting: Adult Health

## 2023-03-15 VITALS — BP 145/79 | HR 69 | Ht 67.0 in | Wt 191.5 lb

## 2023-03-15 DIAGNOSIS — Z1331 Encounter for screening for depression: Secondary | ICD-10-CM

## 2023-03-15 DIAGNOSIS — Z01419 Encounter for gynecological examination (general) (routine) without abnormal findings: Secondary | ICD-10-CM | POA: Diagnosis not present

## 2023-03-15 DIAGNOSIS — K429 Umbilical hernia without obstruction or gangrene: Secondary | ICD-10-CM | POA: Diagnosis not present

## 2023-03-15 DIAGNOSIS — Z1211 Encounter for screening for malignant neoplasm of colon: Secondary | ICD-10-CM | POA: Diagnosis not present

## 2023-03-15 DIAGNOSIS — I10 Essential (primary) hypertension: Secondary | ICD-10-CM | POA: Diagnosis not present

## 2023-03-15 LAB — HEMOCCULT GUIAC POC 1CARD (OFFICE): Fecal Occult Blood, POC: NEGATIVE

## 2023-03-15 NOTE — Progress Notes (Signed)
 Patient ID: Maria Price, female   DOB: [birth year], 63 y.o.   MRN: [id number] History of Present Illness: Maria Price is a 63 year old white female, married, PM in for a well woman gyn exam and pap. She is living in [place] now and works at [employer]  PCP is Dr Phillips Odor    Current Medications, Allergies, Past Medical History, Past Surgical History, Family History and Social History were reviewed in [employer] [facility] electronic medical record.     Review of Systems: Patient denies any headaches, hearing loss, fatigue, blurred vision, shortness of breath, chest pain, abdominal pain, problems with bowel movements, urination, or intercourse. No joint pain or mood swings.     Physical Exam:BP (!) 145/79 (BP Location: Right Arm, Patient Position: Sitting, Cuff Size: Normal)   Pulse 69   Ht 5\' 7"  (1.702 m)   Wt 191 lb 8 oz (86.9 kg)   BMI 29.99 kg/m   General:  Well developed, well nourished, no acute distress Skin:  Warm and dry Neck:  Midline trachea, normal thyroid, good ROM, no lymphadenopathy,no carotid bruits heard Lungs; Clear to auscultation bilaterally Breast:  No dominant palpable mass, retraction, or nipple discharge Cardiovascular: Regular rate and rhythm Abdomen:  Soft, non tender, no hepatosplenomegaly, has small non tender umbilical hernia  Pelvic:  External genitalia is normal in appearance, no lesions.  The vagina is pale. Urethra has no lesions or masses. The cervix is smooth, pap with HR HPV genotyping performed.  Uterus is felt to be normal size, shape, and contour.  No adnexal masses or tenderness noted.Bladder is non tender, no masses felt. Rectal: Good sphincter tone, no polyps, or hemorrhoids felt.  Hemoccult negative. Extremities/musculoskeletal:  No swelling or varicosities noted, no clubbing or cyanosis Psych:  No mood changes, alert and cooperative,seems happy AA is 1 Fall risk is low    03/15/2023    8:35 AM 11/13/2018    2:27 PM  Depression screen PHQ 2/9  Decreased  Interest 0 0  Down, Depressed, Hopeless 0 0  PHQ - 2 Score 0 0  Altered sleeping 0   Tired, decreased energy 0   Change in appetite 0   Feeling bad or failure about yourself  0   Trouble concentrating 0   Moving slowly or fidgety/restless 0   Suicidal thoughts 0   PHQ-9 Score 0        03/15/2023    8:35 AM  GAD 7 : Generalized Anxiety Score  Nervous, Anxious, on Edge 0  Control/stop worrying 0  Worry too much - different things 1  Trouble relaxing 1  Restless 0  Easily annoyed or irritable 0  Afraid - awful might happen 0  Total GAD 7 Score 2    Upstream - 03/15/23 0841       Pregnancy Intention Screening   Does the patient want to become pregnant in the next year? N/A    Does the patient's partner want to become pregnant in the next year? N/A    Would the patient like to discuss contraceptive options today? N/A      Contraception Wrap Up   Current Method Female Sterilization   pm   End Method Female Sterilization   pm   Contraception Counseling Provided No    How was the end contraceptive method provided? N/A            Examination chaperoned by Malachy Mood LPN    Impression and plan: 1. Encounter for gynecological examination with Papanicolaou smear of cervix (  Primary) Pap sent Pap and physical in 3 years here Physical and labs with PCP Mammogram was negative 08/09/22 Colonoscopy this year  - Cytology - PAP( [place])  2. Encounter for screening fecal occult blood testing Hemoccult was negative  - POCT occult blood stool  3. Essential hypertension Take meds and follow up with PCP  4. Umbilical hernia without obstruction and without gangrene If any pain or feels hard go to ER

## 2023-03-19 LAB — CYTOLOGY - PAP
Adequacy: ABSENT
Comment: NEGATIVE
Diagnosis: NEGATIVE
High risk HPV: NEGATIVE

## 2023-03-22 DIAGNOSIS — Z1283 Encounter for screening for malignant neoplasm of skin: Secondary | ICD-10-CM | POA: Diagnosis not present

## 2023-03-22 DIAGNOSIS — D225 Melanocytic nevi of trunk: Secondary | ICD-10-CM | POA: Diagnosis not present

## 2023-03-22 DIAGNOSIS — B078 Other viral warts: Secondary | ICD-10-CM | POA: Diagnosis not present

## 2023-07-24 ENCOUNTER — Other Ambulatory Visit: Payer: Self-pay | Admitting: Family Medicine

## 2023-07-24 DIAGNOSIS — Z1231 Encounter for screening mammogram for malignant neoplasm of breast: Secondary | ICD-10-CM

## 2023-08-14 ENCOUNTER — Ambulatory Visit
Admission: RE | Admit: 2023-08-14 | Discharge: 2023-08-14 | Disposition: A | Discharge status: Home / Self Care | Source: Ambulatory Visit | Attending: Family Medicine | Admitting: Family Medicine

## 2023-08-14 DIAGNOSIS — Z1231 Encounter for screening mammogram for malignant neoplasm of breast: Secondary | ICD-10-CM | POA: Diagnosis not present

## 2023-08-31 DIAGNOSIS — E559 Vitamin D deficiency, unspecified: Secondary | ICD-10-CM | POA: Diagnosis not present

## 2023-08-31 DIAGNOSIS — Z6829 Body mass index (BMI) 29.0-29.9, adult: Secondary | ICD-10-CM | POA: Diagnosis not present

## 2023-08-31 DIAGNOSIS — B880 Other acariasis: Secondary | ICD-10-CM | POA: Diagnosis not present

## 2023-08-31 DIAGNOSIS — E7849 Other hyperlipidemia: Secondary | ICD-10-CM | POA: Diagnosis not present

## 2023-08-31 DIAGNOSIS — I10 Essential (primary) hypertension: Secondary | ICD-10-CM | POA: Diagnosis not present

## 2023-08-31 DIAGNOSIS — E782 Mixed hyperlipidemia: Secondary | ICD-10-CM | POA: Diagnosis not present

## 2023-08-31 DIAGNOSIS — E1159 Type 2 diabetes mellitus with other circulatory complications: Secondary | ICD-10-CM | POA: Diagnosis not present

## 2023-08-31 DIAGNOSIS — E119 Type 2 diabetes mellitus without complications: Secondary | ICD-10-CM | POA: Diagnosis not present

## 2023-08-31 DIAGNOSIS — K635 Polyp of colon: Secondary | ICD-10-CM | POA: Diagnosis not present

## 2023-08-31 DIAGNOSIS — E039 Hypothyroidism, unspecified: Secondary | ICD-10-CM | POA: Diagnosis not present

## 2024-01-29 ENCOUNTER — Ambulatory Visit: Admitting: General Surgery

## 2024-02-12 ENCOUNTER — Ambulatory Visit: Admitting: General Surgery
